# Patient Record
Sex: Female | Born: 1935 | Race: White | Hispanic: No | Smoking: Former smoker
Health system: Southern US, Community
[De-identification: ages and names within clinical notes are randomized; demographics above are authoritative.]

## PROBLEM LIST (undated history)

## (undated) DIAGNOSIS — I10 Essential (primary) hypertension: Secondary | ICD-10-CM

## (undated) DIAGNOSIS — R413 Other amnesia: Secondary | ICD-10-CM

## (undated) DIAGNOSIS — R569 Unspecified convulsions: Secondary | ICD-10-CM

## (undated) HISTORY — DX: Other amnesia: R41.3

## (undated) HISTORY — DX: Essential (primary) hypertension: I10

## (undated) HISTORY — DX: Unspecified convulsions: R56.9

---

## 1996-09-18 HISTORY — PX: OTHER SURGICAL HISTORY: SHX169

## 2004-11-28 ENCOUNTER — Ambulatory Visit: Payer: Self-pay | Admitting: Family Medicine

## 2004-12-06 ENCOUNTER — Ambulatory Visit: Payer: Self-pay | Admitting: Family Medicine

## 2004-12-13 ENCOUNTER — Ambulatory Visit: Payer: Self-pay | Admitting: Family Medicine

## 2005-03-15 ENCOUNTER — Ambulatory Visit: Payer: Self-pay | Admitting: Family Medicine

## 2005-04-19 ENCOUNTER — Ambulatory Visit: Payer: Self-pay | Admitting: Family Medicine

## 2011-09-26 DIAGNOSIS — R569 Unspecified convulsions: Secondary | ICD-10-CM | POA: Diagnosis not present

## 2011-09-26 DIAGNOSIS — Z79899 Other long term (current) drug therapy: Secondary | ICD-10-CM | POA: Diagnosis not present

## 2011-09-26 DIAGNOSIS — R413 Other amnesia: Secondary | ICD-10-CM | POA: Diagnosis not present

## 2011-09-26 DIAGNOSIS — M810 Age-related osteoporosis without current pathological fracture: Secondary | ICD-10-CM | POA: Diagnosis not present

## 2012-01-24 DIAGNOSIS — G40909 Epilepsy, unspecified, not intractable, without status epilepticus: Secondary | ICD-10-CM | POA: Diagnosis not present

## 2012-01-24 DIAGNOSIS — I10 Essential (primary) hypertension: Secondary | ICD-10-CM | POA: Diagnosis not present

## 2012-01-24 DIAGNOSIS — R269 Unspecified abnormalities of gait and mobility: Secondary | ICD-10-CM | POA: Diagnosis not present

## 2012-03-15 DIAGNOSIS — G319 Degenerative disease of nervous system, unspecified: Secondary | ICD-10-CM | POA: Diagnosis not present

## 2012-03-15 DIAGNOSIS — M810 Age-related osteoporosis without current pathological fracture: Secondary | ICD-10-CM | POA: Diagnosis present

## 2012-03-15 DIAGNOSIS — R4182 Altered mental status, unspecified: Secondary | ICD-10-CM | POA: Diagnosis not present

## 2012-03-15 DIAGNOSIS — I7090 Unspecified atherosclerosis: Secondary | ICD-10-CM | POA: Diagnosis not present

## 2012-03-15 DIAGNOSIS — Z79899 Other long term (current) drug therapy: Secondary | ICD-10-CM | POA: Diagnosis not present

## 2012-03-15 DIAGNOSIS — N390 Urinary tract infection, site not specified: Secondary | ICD-10-CM | POA: Diagnosis not present

## 2012-03-15 DIAGNOSIS — G40909 Epilepsy, unspecified, not intractable, without status epilepticus: Secondary | ICD-10-CM | POA: Diagnosis present

## 2012-03-15 DIAGNOSIS — I6789 Other cerebrovascular disease: Secondary | ICD-10-CM | POA: Diagnosis not present

## 2012-03-15 DIAGNOSIS — E876 Hypokalemia: Secondary | ICD-10-CM | POA: Diagnosis not present

## 2012-03-15 DIAGNOSIS — Z8673 Personal history of transient ischemic attack (TIA), and cerebral infarction without residual deficits: Secondary | ICD-10-CM | POA: Diagnosis not present

## 2012-03-15 DIAGNOSIS — R0789 Other chest pain: Secondary | ICD-10-CM | POA: Diagnosis not present

## 2012-03-15 DIAGNOSIS — T426X1A Poisoning by other antiepileptic and sedative-hypnotic drugs, accidental (unintentional), initial encounter: Secondary | ICD-10-CM | POA: Diagnosis not present

## 2012-03-15 DIAGNOSIS — F29 Unspecified psychosis not due to a substance or known physiological condition: Secondary | ICD-10-CM | POA: Diagnosis present

## 2012-03-15 DIAGNOSIS — I10 Essential (primary) hypertension: Secondary | ICD-10-CM | POA: Diagnosis present

## 2012-03-15 DIAGNOSIS — J984 Other disorders of lung: Secondary | ICD-10-CM | POA: Diagnosis not present

## 2012-03-25 DIAGNOSIS — R269 Unspecified abnormalities of gait and mobility: Secondary | ICD-10-CM | POA: Diagnosis not present

## 2012-03-25 DIAGNOSIS — G40919 Epilepsy, unspecified, intractable, without status epilepticus: Secondary | ICD-10-CM | POA: Diagnosis not present

## 2012-03-25 DIAGNOSIS — R4182 Altered mental status, unspecified: Secondary | ICD-10-CM | POA: Diagnosis not present

## 2012-03-25 DIAGNOSIS — Z79899 Other long term (current) drug therapy: Secondary | ICD-10-CM | POA: Diagnosis not present

## 2012-03-26 DIAGNOSIS — E876 Hypokalemia: Secondary | ICD-10-CM | POA: Diagnosis not present

## 2012-03-26 DIAGNOSIS — N390 Urinary tract infection, site not specified: Secondary | ICD-10-CM | POA: Diagnosis not present

## 2012-03-26 DIAGNOSIS — R569 Unspecified convulsions: Secondary | ICD-10-CM | POA: Diagnosis not present

## 2012-03-26 DIAGNOSIS — R403 Persistent vegetative state: Secondary | ICD-10-CM | POA: Diagnosis not present

## 2012-04-09 DIAGNOSIS — I10 Essential (primary) hypertension: Secondary | ICD-10-CM | POA: Diagnosis not present

## 2012-04-09 DIAGNOSIS — I959 Hypotension, unspecified: Secondary | ICD-10-CM | POA: Diagnosis not present

## 2012-04-09 DIAGNOSIS — E876 Hypokalemia: Secondary | ICD-10-CM | POA: Diagnosis not present

## 2012-04-09 DIAGNOSIS — N390 Urinary tract infection, site not specified: Secondary | ICD-10-CM | POA: Diagnosis not present

## 2012-05-07 DIAGNOSIS — I10 Essential (primary) hypertension: Secondary | ICD-10-CM | POA: Diagnosis not present

## 2012-05-07 DIAGNOSIS — E876 Hypokalemia: Secondary | ICD-10-CM | POA: Diagnosis not present

## 2012-05-23 DIAGNOSIS — Z79899 Other long term (current) drug therapy: Secondary | ICD-10-CM | POA: Diagnosis not present

## 2012-05-23 DIAGNOSIS — R269 Unspecified abnormalities of gait and mobility: Secondary | ICD-10-CM | POA: Diagnosis not present

## 2012-05-23 DIAGNOSIS — I10 Essential (primary) hypertension: Secondary | ICD-10-CM | POA: Diagnosis not present

## 2012-05-23 DIAGNOSIS — R569 Unspecified convulsions: Secondary | ICD-10-CM | POA: Diagnosis not present

## 2012-06-28 DIAGNOSIS — Z23 Encounter for immunization: Secondary | ICD-10-CM | POA: Diagnosis not present

## 2012-07-09 DIAGNOSIS — M810 Age-related osteoporosis without current pathological fracture: Secondary | ICD-10-CM | POA: Diagnosis not present

## 2012-07-09 DIAGNOSIS — I10 Essential (primary) hypertension: Secondary | ICD-10-CM | POA: Diagnosis not present

## 2012-07-25 DIAGNOSIS — R7989 Other specified abnormal findings of blood chemistry: Secondary | ICD-10-CM | POA: Diagnosis not present

## 2012-09-03 DIAGNOSIS — G40909 Epilepsy, unspecified, not intractable, without status epilepticus: Secondary | ICD-10-CM | POA: Diagnosis not present

## 2012-09-03 DIAGNOSIS — Z79899 Other long term (current) drug therapy: Secondary | ICD-10-CM | POA: Diagnosis not present

## 2012-09-03 DIAGNOSIS — R269 Unspecified abnormalities of gait and mobility: Secondary | ICD-10-CM | POA: Diagnosis not present

## 2012-10-02 DIAGNOSIS — M810 Age-related osteoporosis without current pathological fracture: Secondary | ICD-10-CM | POA: Diagnosis not present

## 2012-10-14 DIAGNOSIS — I10 Essential (primary) hypertension: Secondary | ICD-10-CM | POA: Diagnosis not present

## 2012-10-29 DIAGNOSIS — I10 Essential (primary) hypertension: Secondary | ICD-10-CM | POA: Diagnosis not present

## 2012-12-24 DIAGNOSIS — G40909 Epilepsy, unspecified, not intractable, without status epilepticus: Secondary | ICD-10-CM | POA: Diagnosis not present

## 2012-12-24 DIAGNOSIS — R269 Unspecified abnormalities of gait and mobility: Secondary | ICD-10-CM | POA: Diagnosis not present

## 2012-12-24 DIAGNOSIS — I10 Essential (primary) hypertension: Secondary | ICD-10-CM | POA: Diagnosis not present

## 2012-12-24 DIAGNOSIS — Z79899 Other long term (current) drug therapy: Secondary | ICD-10-CM | POA: Diagnosis not present

## 2012-12-30 ENCOUNTER — Encounter: Payer: Self-pay | Admitting: Family Medicine

## 2012-12-30 ENCOUNTER — Emergency Department (HOSPITAL_COMMUNITY)
Admission: EM | Admit: 2012-12-30 | Discharge: 2012-12-30 | Disposition: A | Discharge status: Home / Self Care | Payer: Medicare Other | Attending: Emergency Medicine | Admitting: Emergency Medicine

## 2012-12-30 ENCOUNTER — Encounter (HOSPITAL_COMMUNITY): Payer: Self-pay | Admitting: Emergency Medicine

## 2012-12-30 ENCOUNTER — Ambulatory Visit (INDEPENDENT_AMBULATORY_CARE_PROVIDER_SITE_OTHER): Payer: Medicare Other | Admitting: Family Medicine

## 2012-12-30 VITALS — BP 175/94 | HR 140 | Temp 97.6°F | Ht 60.0 in | Wt 83.0 lb

## 2012-12-30 DIAGNOSIS — R6889 Other general symptoms and signs: Secondary | ICD-10-CM | POA: Diagnosis not present

## 2012-12-30 DIAGNOSIS — I10 Essential (primary) hypertension: Secondary | ICD-10-CM | POA: Diagnosis not present

## 2012-12-30 DIAGNOSIS — I498 Other specified cardiac arrhythmias: Secondary | ICD-10-CM

## 2012-12-30 DIAGNOSIS — I471 Supraventricular tachycardia, unspecified: Secondary | ICD-10-CM | POA: Insufficient documentation

## 2012-12-30 DIAGNOSIS — R413 Other amnesia: Secondary | ICD-10-CM | POA: Insufficient documentation

## 2012-12-30 DIAGNOSIS — G40909 Epilepsy, unspecified, not intractable, without status epilepticus: Secondary | ICD-10-CM | POA: Insufficient documentation

## 2012-12-30 DIAGNOSIS — R000 Tachycardia, unspecified: Secondary | ICD-10-CM | POA: Diagnosis not present

## 2012-12-30 DIAGNOSIS — Z87891 Personal history of nicotine dependence: Secondary | ICD-10-CM | POA: Diagnosis not present

## 2012-12-30 DIAGNOSIS — Z79899 Other long term (current) drug therapy: Secondary | ICD-10-CM | POA: Diagnosis not present

## 2012-12-30 DIAGNOSIS — R9431 Abnormal electrocardiogram [ECG] [EKG]: Secondary | ICD-10-CM | POA: Insufficient documentation

## 2012-12-30 DIAGNOSIS — M810 Age-related osteoporosis without current pathological fracture: Secondary | ICD-10-CM | POA: Insufficient documentation

## 2012-12-30 LAB — CBC WITH DIFFERENTIAL/PLATELET
Basophils Absolute: 0 10*3/uL (ref 0.0–0.1)
HCT: 37.5 % (ref 36.0–46.0)
Hemoglobin: 13 g/dL (ref 12.0–15.0)
Lymphocytes Relative: 11 % — ABNORMAL LOW (ref 12–46)
Monocytes Absolute: 0.4 10*3/uL (ref 0.1–1.0)
Neutro Abs: 7.9 10*3/uL — ABNORMAL HIGH (ref 1.7–7.7)
RBC: 4.3 MIL/uL (ref 3.87–5.11)
RDW: 12.9 % (ref 11.5–15.5)
WBC: 9.5 10*3/uL (ref 4.0–10.5)

## 2012-12-30 MED ORDER — METOPROLOL TARTRATE 25 MG PO TABS
25.0000 mg | ORAL_TABLET | Freq: Once | ORAL | Status: AC
  Administered 2012-12-30: 25 mg via ORAL
  Filled 2012-12-30: qty 1

## 2012-12-30 MED ORDER — METOPROLOL TARTRATE 50 MG PO TABS: 25.0000 mg | ORAL_TABLET | Freq: Two times a day (BID) | ORAL | Status: DC

## 2012-12-30 NOTE — ED Notes (Signed)
Pt arrives to ed via [facility].  ems sts pt was being seen at pcp and noticed bp 175/94 Hr 140.  ems obtained bp183/77 HR 104. Pt caox4, pmsx4, no loc, no HA, no change vision.  Pt denies recent illness/injury.

## 2012-12-30 NOTE — Consult Note (Signed)
CARDIOLOGY CONSULT NOTE  Patient ID: Melinda Ayala MRN: [id number] DOB/AGE: 25-Jan-1922 79 y.o.  Admit date: 12/30/2012 Primary Physician Dr. Modesto Charon  Primary Cardiologist New Reason for Consultation Tachycardia.    HPI: 79 y/o female with no previous cardiac history. She has known history of hypertension, subdural hematoma s/p surgery in 1986, and seizure disorder. She presented with her PCP's office for a routine follow up visit regarding hypertension. She was noted to have SBP>200. HR was initially 110 BPM which increased to 140 BPM. ECG was suggestive of SVT with underlying IVCD. She converted spontaneously to NSR without intervention.  She denies any symptoms. No chest pain, dyspnea, palpitations or dizziness.  She lives with her daughter.  She is on lisinopril 2.5 mg once daily for hypertension.   Family history: Son with MI (last month).      Review of systems complete and found to be negative unless listed above   Past Medical History  Diagnosis Date  . Seizures   . Hypertension   . Memory loss     History reviewed. No pertinent family history.  History   Social History  . Marital Status: Widowed    Spouse Name: N/A    Number of Children: N/A  . Years of Education: N/A   Occupational History  . Not on file.   Social History Main Topics  . Smoking status: Former Smoker    Types: Cigarettes    Quit date: 09/18/1974  . Smokeless tobacco: Current User    Types: Snuff     Comment: dips snuff  . Alcohol Use: Not on file  . Drug Use: Not on file  . Sexually Active: Not on file   Other Topics Concern  . Not on file   Social History Narrative  . No narrative on file    Past Surgical History  Procedure Laterality Date  . Blood clot  1998    on brain       (Not in a hospital admission)  Physical Exam: Blood pressure 176/80, pulse 103, temperature 98.3 F (36.8 C), temperature source Oral, resp. rate 16, SpO2 99.00%.   Constitutional: She is oriented  to person, place, and time. She appears underweight. No distress.  HENT: No nasal discharge.  Head: Normocephalic and atraumatic.  Eyes: Pupils are equal and round.  Neck: Normal range of motion. Neck supple. No JVD present. No thyromegaly present.  Cardiovascular: Normal rate, regular rhythm, normal heart sounds. Exam reveals no gallop and no friction rub. No murmur heard.  Pulmonary/Chest: Effort normal and breath sounds normal. No stridor. No respiratory distress. She has no wheezes. She has no rales. She exhibits no tenderness.  Abdominal: Soft. Bowel sounds are normal. She exhibits no distension. There is no tenderness. There is no rebound and no guarding.  Musculoskeletal: Normal range of motion. She exhibits no edema and no tenderness.  Neurological: She is alert and oriented to person, place, and time. Coordination normal.  Skin: Skin is warm and dry. No rash noted. She is not diaphoretic. No erythema. No pallor.  Psychiatric: She has a normal mood and affect. Her behavior is normal. Judgment and thought content normal.    Labs:   Lab Results  Component Value Date   WBC 9.5 12/30/2012   HGB 13.0 12/30/2012   HCT 37.5 12/30/2012   MCV 87.2 12/30/2012   PLT 244 12/30/2012   No results found for this basename: NA, K, CL, CO2, BUN, CREATININE, CALCIUM, LABALBU, PROT, BILITOT, ALKPHOS, ALT,  AST, GLUCOSE,  in the last 168 hours No results found for this basename: CKTOTAL, CKMB, CKMBINDEX, TROPONINI    No results found for this basename: CHOL   No results found for this basename: HDL   No results found for this basename: LDLCALC   No results found for this basename: TRIG   No results found for this basename: CHOLHDL   No results found for this basename: LDLDIRECT      EKG: SVT with IVCD. Currently in NSR with IVCD  ASSESSMENT AND PLAN:    1. Paroxysmal SVT: this seems to be first documented episode. She seems to be asymptomatic. Check routine labs including CBC, BMP and TSH.    Recommend starting Metoprolol 25 mg bid.  If she has recurrent arrhythmia, then an echo and cardiology follow up is recommended.   2. Hypertension: BP is elevated. Hopefully, this will improve with Metoprolol.    Signed:  Lorine Bears MD, [facility] 12/30/2012, 5:16 PM

## 2012-12-30 NOTE — Progress Notes (Signed)
Patient ID: Melinda Ayala, female   DOB: Dec 11, 1921, 79 y.o.   MRN: [id number] SUBJECTIVE: HPI: Patient is here for follow up of hypertension: denies Headache;deniesChest Pain;denies weakness;denies Shortness of Breath or Orthopnea;denies Visual changes;denies palpitations;denies cough;denies pedal edema;denies symptoms of TIA or stroke; admits to Compliance with medications. denies Problems with medications.  No Change in memory impairment. Accompanied by son, who has history of heart disease and a pacemaker.  PMH/PSH: reviewed/updated in Epic  SH/FH: reviewed/updated in Epic  Allergies: reviewed/updated in Epic  Medications: reviewed/updated in Epic  Immunizations: reviewed/updated in Epic  ROS: As above in the HPI. All other systems are stable or negative.  OBJECTIVE: APPEARANCE:  Elderly frail Patient in no acute distress.The patient appeared well nourished and normally developed. Acyanotic. Waist: VITAL SIGNS:BP 175/94  Pulse 140  Temp(Src) 97.6 F (36.4 C) (Oral)  Ht 5' (1.524 m)  Wt 83 lb (37.649 kg)  BMI 16.21 kg/m2 kyphosis  SKIN: warm and  Dry without overt rashes, tattoos and scars  HEAD and Neck: without JVD, Head and scalp: normal Eyes:No scleral icterus. Fundi normal, eye movements normal. Ears: Auricle normal, canal normal, Tympanic membranes normal, insufflation normal. Nose: normal Throat: normal Neck & thyroid: normal  CHEST & LUNGS: Chest wall: normal Lungs: Clear  CVS: Reveals the PMI to be normally located. IRRegular rhythm, Tachycardic First and Second Heart sounds are heardl,  absence of murmurs, rubsPeripheral vasculature: Radial pulses: abnormal rapid and irregular   ABDOMEN:  Appearance: normal Benign,, no organomegaly, no masses, no Abdominal Aortic enlargement. No Guarding , no rebound. No Bruits. Bowel sounds: normal  RECTAL: N/A GU: N/A  EXTREMETIES: nonedematous.   MUSCULOSKELETAL:  Spine:  kyphotic   NEUROLOGIC: oriented to time,place and person; nonfocal. Strength is normal  ASSESSMENT: HTN (hypertension) - Plan: EKG 12-Lead  SVT (supraventricular tachycardia)  Tachycardia - Plan: EKG 12-Lead  Seizure disorder  Osteoporosis, unspecified  Memory loss  Nonspecific abnormal electrocardiogram (ECG) (EKG) New onset SVT with lateral wall ischemia in a 79 yr old woman. Needs further evaluation and treatment in ED   PLAN: Orders Placed This Encounter  Procedures  . EKG 12-Lead   No results found for this or any previous visit (from the past 24 hour(s)). Meds ordered this encounter  Medications  . TEGRETOL-XR 200 MG 12 hr tablet    Sig:   . B-D UF III MINI PEN NEEDLES 31G X 5 MM MISC    Sig:   . VIMPAT 100 MG TABS    Sig:   . lisinopril (PRINIVIL,ZESTRIL) 2.5 MG tablet    Sig:   . FORTEO 600 MCG/2.4ML SOLN    Sig:   IV KVO O2 via nasal cannula Transfer to [facility] Ed for further evaluation and treatment.  Azizah Lisle P. Modesto Charon, M.D.

## 2012-12-30 NOTE — ED Provider Notes (Signed)
History     CSN: [id number]  Arrival date & time 12/30/12  1430   First MD Initiated Contact with Patient 12/30/12 1433      Chief Complaint  Patient presents with  . Hypertension    (Consider location/radiation/quality/duration/timing/severity/associated sxs/prior treatment) HPI Patient seen by Dr. Modesto Charon this morning for a routine visit for hypertension. Patient asymptomatic during visit. Noted to be in SVT rhythm at Dr. Nash Dimmer office sent here for further evaluation. Patient denies any chest pain denies shortness of breath denies abdominal pain no treatment prior to coming here. Rhythm converted spontaneously Past Medical History  Diagnosis Date  . Seizures   . Hypertension   . Memory loss     Past Surgical History  Procedure Laterality Date  . Blood clot  1998    on brain     History reviewed. No pertinent family history.  History  Substance Use Topics  . Smoking status: Former Smoker    Types: Cigarettes    Quit date: 09/18/1974  . Smokeless tobacco: Current User    Types: Snuff     Comment: dips snuff  . Alcohol Use: Not on file    OB History   Grav Para Term Preterm Abortions TAB SAB Ect Mult Living                  Review of Systems  Constitutional: Negative.   HENT: Negative.   Respiratory: Negative.   Cardiovascular: Negative.   Gastrointestinal: Negative.   Musculoskeletal: Negative.   Skin: Negative.   Neurological: Negative.   Psychiatric/Behavioral: Negative.     Allergies  Review of patient's allergies indicates no known allergies.  Home Medications   Current Outpatient Rx  Name  Route  Sig  Dispense  Refill  . FORTEO 600 MCG/2.4ML SOLN               . lisinopril (PRINIVIL,ZESTRIL) 2.5 MG tablet               . TEGRETOL-XR 200 MG 12 hr tablet               . VIMPAT 100 MG TABS                 BP 176/80  Pulse 103  Temp(Src) 98.3 F (36.8 C) (Oral)  Resp 16  SpO2 99%  Physical Exam  Nursing note and  vitals reviewed. Constitutional: She is oriented to person, place, and time. She appears well-developed and well-nourished.  HENT:  Head: Normocephalic and atraumatic.  Eyes: Conjunctivae are normal. Pupils are equal, round, and reactive to light.  Neck: Neck supple. No tracheal deviation present. No thyromegaly present.  Cardiovascular: Normal rate and regular rhythm.   No murmur heard. Pulmonary/Chest: Effort normal and breath sounds normal.  Abdominal: Soft. Bowel sounds are normal. She exhibits no distension. There is no tenderness.  Musculoskeletal: Normal range of motion. She exhibits no edema and no tenderness.  Neurological: She is alert and oriented to person, place, and time. Coordination normal.  Skin: Skin is warm and dry. No rash noted.  Psychiatric: She has a normal mood and affect.    ED Course  Procedures (including critical care time)  Labs Reviewed  CBC WITH DIFFERENTIAL   No results found.   Date: 12/30/2012 1431 PM  Rate: 100  Rhythm: sinus tachycardia  QRS Axis: normal  Intervals: normal  ST/T Wave abnormalities: nonspecific ST/T changes  Conduction Disutrbances:left bundle branch block  Narrative Interpretation:   Old EKG  Reviewed: tracing from 12:31 PM today showed SVT rhythm 140 beats per minute left bundle branch block otherwise unchanged interpreted by me   Results for orders placed during the hospital encounter of 12/30/12  CBC WITH DIFFERENTIAL      Result Value Range   WBC 9.5  4.0 - 10.5 K/uL   RBC 4.30  3.87 - 5.11 MIL/uL   Hemoglobin 13.0  12.0 - 15.0 g/dL   HCT 40.9  81.1 - 91.4 %   MCV 87.2  78.0 - 100.0 fL   MCH 30.2  26.0 - 34.0 pg   MCHC 34.7  30.0 - 36.0 g/dL   RDW 78.2  95.6 - 21.3 %   Platelets 244  150 - 400 K/uL   Neutrophils Relative 84 (*) 43 - 77 %   Neutro Abs 7.9 (*) 1.7 - 7.7 K/uL   Lymphocytes Relative 11 (*) 12 - 46 %   Lymphs Abs 1.1  0.7 - 4.0 K/uL   Monocytes Relative 4  3 - 12 %   Monocytes Absolute 0.4  0.1 -  1.0 K/uL   Eosinophils Relative 1  0 - 5 %   Eosinophils Absolute 0.1  0.0 - 0.7 K/uL   Basophils Relative 0  0 - 1 %   Basophils Absolute 0.0  0.0 - 0.1 K/uL   No results found.  No diagnosis found. i-STAT 8 is within normal limits Remained asymptomaticand in normal sinus rhythm throughout entire emergency department course MDM  Spoke with [facility] who will evalaute pt in the ED.patient was evaluated Dr. Kirke Corin From cardiology. Plan prescription for Toprol 25 mg twice a day one month's supply written follow up with Dr.Wong in one week TSH to be checked as outpatient by Dr. Modesto Charon Blood pressure recheck next week Diagnosis:#1paroxysmal supraventricular tachycardia  #2 hypertension      Doug Sou, MD 12/30/12 1819

## 2012-12-30 NOTE — Progress Notes (Signed)
ekg performed and per Dr Abundio Miu [facility] notified and spoke to [facility] triage ER and info given to her Pt will be transported via EMS   O2at @L  per nasal cannula Family member at bedside

## 2012-12-31 ENCOUNTER — Telehealth: Payer: Self-pay | Admitting: Family Medicine

## 2012-12-31 NOTE — Telephone Encounter (Signed)
Appt given for 4-21 per pt request

## 2013-01-06 ENCOUNTER — Encounter: Payer: Self-pay | Admitting: Family Medicine

## 2013-01-06 ENCOUNTER — Ambulatory Visit (INDEPENDENT_AMBULATORY_CARE_PROVIDER_SITE_OTHER): Payer: Medicare Other | Admitting: Family Medicine

## 2013-01-06 VITALS — BP 153/66 | HR 72 | Temp 97.2°F | Ht 61.0 in | Wt 83.2 lb

## 2013-01-06 DIAGNOSIS — M810 Age-related osteoporosis without current pathological fracture: Secondary | ICD-10-CM

## 2013-01-06 DIAGNOSIS — R413 Other amnesia: Secondary | ICD-10-CM | POA: Diagnosis not present

## 2013-01-06 DIAGNOSIS — I10 Essential (primary) hypertension: Secondary | ICD-10-CM | POA: Diagnosis not present

## 2013-01-06 DIAGNOSIS — G40909 Epilepsy, unspecified, not intractable, without status epilepticus: Secondary | ICD-10-CM | POA: Diagnosis not present

## 2013-01-06 NOTE — Progress Notes (Signed)
Patient ID: Melinda Ayala, female   DOB: 1922/02/20, 79 y.o.   MRN: [id number] SUBJECTIVE: HPI: Patient is here for follow up of hypertension: denies Headache;deniesChest Pain;denies weakness;denies Shortness of Breath or Orthopnea;denies Visual changes;denies palpitations;denies cough;denies pedal edema;denies symptoms of TIA or stroke; admits to Compliance with medications. denies Problems with medications. Doing Great feeling great  PMH/PSH: reviewed/updated in Epic  SH/FH: reviewed/updated in Epic  Allergies: reviewed/updated in Epic  Medications: reviewed/updated in Epic  Immunizations: reviewed/updated in Epic  ROS: As above in the HPI. All other systems are stable or negative.  OBJECTIVE: APPEARANCE:  Patient in no acute distress.The patient appeared well nourished and normally developed. Acyanotic. Waist: VITAL SIGNS:BP 153/66  Pulse 72  Temp(Src) 97.2 F (36.2 C) (Oral)  Ht 5\' 1"  (1.549 m)  Wt 83 lb 3.2 oz (37.739 kg)  BMI 15.73 kg/m2 kyphosis  SKIN: warm and  Dry without overt rashes, tattoos and scars  HEAD and Neck: without JVD, Head and scalp: normal Eyes:No scleral icterus. Fundi normal, eye movements normal. Ears: Auricle normal, canal normal, Tympanic membranes normal, insufflation normal. Nose: normal Throat: normal Neck & thyroid: normal  CHEST & LUNGS: Chest wall: normal Lungs: Clear  CVS: Reveals the PMI to be normally located. Regular rhythm, First and Second Heart sounds are normal,  absence of murmurs, rubs or gallops. Peripheral vasculature: Radial pulses: normal Dorsal pedis pulses: normal Posterior pulses: normal  ABDOMEN:  Appearance: normal Benign,, no organomegaly, no masses, no Abdominal Aortic enlargement. No Guarding , no rebound. No Bruits. Bowel sounds: normal  RECTAL: N/A GU: N/A  EXTREMETIES: nonedematous. Both Femoral and Pedal pulses are normal.  MUSCULOSKELETAL:  Spine:kyphosis Joints: intact. Old right  clavicular fracture/ deformity.  NEUROLOGIC: oriented to time,place and person; nonfocal. Strength is normal Sensory is normal Reflexes are normal  ASSESSMENT: HTN (hypertension)  Memory loss  Osteoporosis, unspecified  Seizure disorder  pressures and for this elderly lady. Any further lowering of blood pressure would produce symptoms and risk.  PLAN:  No orders of the defined types were placed in this encounter.   No results found for this or any previous visit (from the past 24 hour(s)). No orders of the defined types were placed in this encounter.   Condition stable. Continue present medication. Continue home care. Family is taking care of her. Return to clinic in 3 Continue with  the neurologist  Nattaly Yebra P. Modesto Charon, M.D.

## 2013-01-09 ENCOUNTER — Telehealth: Payer: Self-pay | Admitting: Family Medicine

## 2013-01-13 NOTE — Telephone Encounter (Signed)
Prior Auth Dept was working on last week.  If PA still pending then can give patient 1 month sample until approved

## 2013-01-13 NOTE — Telephone Encounter (Signed)
[name]/Michelle, Could you please look into this and take care of it please. Thanks. FW

## 2013-01-13 NOTE — Telephone Encounter (Signed)
PER NOTES BY DONNA LAWSON PT TO CHECK WITH [facility] TO SEE IF MEDICINE FOR FORTEO WILL PROCESS NOW. NOTES SAYS WILL COVER UNTIL 01/10/13

## 2013-01-27 ENCOUNTER — Other Ambulatory Visit: Payer: Self-pay | Admitting: Family Medicine

## 2013-02-05 ENCOUNTER — Other Ambulatory Visit: Payer: Self-pay | Admitting: Family Medicine

## 2013-04-17 ENCOUNTER — Encounter: Payer: Self-pay | Admitting: Family Medicine

## 2013-04-17 ENCOUNTER — Ambulatory Visit (INDEPENDENT_AMBULATORY_CARE_PROVIDER_SITE_OTHER): Payer: Medicare Other | Admitting: Family Medicine

## 2013-04-17 VITALS — BP 139/52 | HR 65 | Temp 97.1°F | Wt 84.6 lb

## 2013-04-17 DIAGNOSIS — I10 Essential (primary) hypertension: Secondary | ICD-10-CM | POA: Diagnosis not present

## 2013-04-17 DIAGNOSIS — I498 Other specified cardiac arrhythmias: Secondary | ICD-10-CM

## 2013-04-17 DIAGNOSIS — G40909 Epilepsy, unspecified, not intractable, without status epilepticus: Secondary | ICD-10-CM

## 2013-04-17 DIAGNOSIS — I471 Supraventricular tachycardia, unspecified: Secondary | ICD-10-CM

## 2013-04-17 DIAGNOSIS — R413 Other amnesia: Secondary | ICD-10-CM | POA: Diagnosis not present

## 2013-04-17 DIAGNOSIS — R000 Tachycardia, unspecified: Secondary | ICD-10-CM

## 2013-04-17 DIAGNOSIS — M810 Age-related osteoporosis without current pathological fracture: Secondary | ICD-10-CM

## 2013-04-17 MED ORDER — LISINOPRIL 2.5 MG PO TABS: ORAL_TABLET | ORAL | Status: DC

## 2013-04-17 MED ORDER — METOPROLOL TARTRATE 50 MG PO TABS: ORAL_TABLET | ORAL | Status: DC

## 2013-04-17 NOTE — Progress Notes (Signed)
Patient ID: Melinda Ayala, female   DOB: 10-30-1921, 79 y.o.   MRN: [id number] SUBJECTIVE: CC: Chief Complaint  Patient presents with  . Follow-up    3  month     HPI: Patient is here for follow up of hypertension:/memory loss/seizure disorder denies Headache;deniesChest Pain;denies weakness;denies Shortness of Breath or Orthopnea;denies Visual changes;denies palpitations;denies cough;denies pedal edema;denies symptoms of TIA or stroke; admits to Compliance with medications. denies Problems with medications.  Past Medical History  Diagnosis Date  . Seizures   . Hypertension   . Memory loss    Past Surgical History  Procedure Laterality Date  . Blood clot  1998    on brain    History   Social History  . Marital Status: Widowed    Spouse Name: N/A    Number of Children: N/A  . Years of Education: N/A   Occupational History  . Not on file.   Social History Main Topics  . Smoking status: Former Smoker    Types: Cigarettes    Quit date: 09/18/1974  . Smokeless tobacco: Current User    Types: Snuff     Comment: dips snuff  . Alcohol Use: Not on file  . Drug Use: Not on file  . Sexually Active: Not on file   Other Topics Concern  . Not on file   Social History Narrative  . No narrative on file   No family history on file. Current Outpatient Prescriptions on File Prior to Visit  Medication Sig Dispense Refill  . FORTEO 600 MCG/2.4ML SOLN Inject into the skin daily. 0.6 ml      . Iron-Vitamins (GERITOL PO) Take 1 tablet by mouth daily.      Marland Kitchen lisinopril (PRINIVIL,ZESTRIL) 2.5 MG tablet TAKE 1 TABLET BY MOUTH ONCE DAILY  30 tablet  3  . metoprolol (LOPRESSOR) 50 MG tablet TAKE 1/2 TABLET 2 TIMES A DAY  30 tablet  2  . TEGRETOL-XR 200 MG 12 hr tablet Take 200 mg by mouth 2 (two) times daily.       Marland Kitchen VIMPAT 100 MG TABS Take 100 mg by mouth 2 (two) times daily.        No current facility-administered medications on file prior to visit.   No Known  Allergies Immunization History  Administered Date(s) Administered  . Pneumococcal Polysaccharide 10/19/2008   Prior to Admission medications   Medication Sig Start Date End Date Taking? Authorizing Provider  B-D UF III MINI PEN NEEDLES 31G X 5 MM MISC  02/24/13  Yes Historical Provider, MD  FORTEO 600 MCG/2.4ML SOLN Inject into the skin daily. 0.6 ml 11/21/12  Yes Historical Provider, MD  Iron-Vitamins (GERITOL PO) Take 1 tablet by mouth daily.   Yes Historical Provider, MD  lisinopril (PRINIVIL,ZESTRIL) 2.5 MG tablet TAKE 1 TABLET BY MOUTH ONCE DAILY 02/05/13  Yes Ileana Ladd, MD  metoprolol (LOPRESSOR) 50 MG tablet TAKE 1/2 TABLET 2 TIMES A DAY 01/27/13  Yes Ileana Ladd, MD  TEGRETOL-XR 200 MG 12 hr tablet Take 200 mg by mouth 2 (two) times daily.  12/24/12  Yes Historical Provider, MD  VIMPAT 100 MG TABS Take 100 mg by mouth 2 (two) times daily.  12/06/12  Yes Historical Provider, MD    ROS: As above in the HPI. All other systems are stable or negative.  OBJECTIVE: APPEARANCE:  Patient in no acute distress.The patient appeared well nourished and normally developed. Acyanotic. Waist: VITAL SIGNS:BP 139/52  Pulse 65  Temp(Src) 97.1 F (36.2  C) (Oral)  Wt 84 lb 9.6 oz (38.374 kg)  BMI 15.99 kg/m2  Elderly Frail WF kyphosis  SKIN: warm and  Dry without overt rashes, tattoos and scars  HEAD and Neck: without JVD, Head and scalp: normal Eyes:No scleral icterus. Fundi normal, eye movements normal. Ears: Auricle normal, canal normal, Tympanic membranes normal, insufflation normal. Nose: normal Throat: normal. Missing teeth Neck & thyroid: normal  CHEST & LUNGS: Chest wall: normal Lungs: Clear  CVS: Reveals the PMI to be normally located. Regular rhythm, First and Second Heart sounds are normal,  absence of murmurs, rubs or gallops. Peripheral vasculature: Radial pulses: normal Dorsal pedis pulses: normal Posterior pulses: normal  ABDOMEN:  Appearance: normal Benign,  no organomegaly, no masses, no Abdominal Aortic enlargement. No Guarding , no rebound. No Bruits. Bowel sounds: normal  RECTAL: N/A GU: N/A  EXTREMETIES: nonedematous. Both Femoral and Pedal pulses are normal.  MUSCULOSKELETAL:  Spine: kyphosis  NEUROLOGIC: oriented to time,place and person; nonfocal.  ASSESSMENT: HTN (hypertension) - Plan: metoprolol (LOPRESSOR) 50 MG tablet, lisinopril (PRINIVIL,ZESTRIL) 2.5 MG tablet, BMP8+EGFR, TSH  SVT (supraventricular tachycardia)  Memory loss  Seizure disorder  Osteoporosis, unspecified  Tachycardia    PLAN:  Orders Placed This Encounter  Procedures  . BMP8+EGFR  . TSH   Meds ordered this encounter  Medications  . B-D UF III MINI PEN NEEDLES 31G X 5 MM MISC    Sig:   . metoprolol (LOPRESSOR) 50 MG tablet    Sig: TAKE 1/2 TABLET 2 TIMES A DAY    Dispense:  30 tablet    Refill:  11  . lisinopril (PRINIVIL,ZESTRIL) 2.5 MG tablet    Sig: TAKE 1 TABLET BY MOUTH ONCE DAILY    Dispense:  30 tablet    Refill:  11   Results for orders placed during the hospital encounter of 12/30/12  CBC WITH DIFFERENTIAL      Result Value Range   WBC 9.5  4.0 - 10.5 K/uL   RBC 4.30  3.87 - 5.11 MIL/uL   Hemoglobin 13.0  12.0 - 15.0 g/dL   HCT 16.1  09.6 - 04.5 %   MCV 87.2  78.0 - 100.0 fL   MCH 30.2  26.0 - 34.0 pg   MCHC 34.7  30.0 - 36.0 g/dL   RDW 40.9  81.1 - 91.4 %   Platelets 244  150 - 400 K/uL   Neutrophils Relative % 84 (*) 43 - 77 %   Neutro Abs 7.9 (*) 1.7 - 7.7 K/uL   Lymphocytes Relative 11 (*) 12 - 46 %   Lymphs Abs 1.1  0.7 - 4.0 K/uL   Monocytes Relative 4  3 - 12 %   Monocytes Absolute 0.4  0.1 - 1.0 K/uL   Eosinophils Relative 1  0 - 5 %   Eosinophils Absolute 0.1  0.0 - 0.7 K/uL   Basophils Relative 0  0 - 1 %   Basophils Absolute 0.0  0.0 - 0.1 K/uL  POCT I-STAT TROPONIN I      Result Value Range   Troponin i, poc 0.02  0.00 - 0.08 ng/mL   Comment 3            Return in about 4 months (around  08/17/2013) for Recheck medical problems.  Tuwanna Krausz P. Modesto Charon, M.D.

## 2013-04-18 LAB — BMP8+EGFR
BUN/Creatinine Ratio: 17 (ref 11–26)
BUN: 16 mg/dL (ref 8–27)
CO2: 24 mmol/L (ref 18–29)
Calcium: 10.3 mg/dL — ABNORMAL HIGH (ref 8.6–10.2)
Chloride: 102 mmol/L (ref 97–108)
Creatinine, Ser: 0.92 mg/dL (ref 0.57–1.00)
GFR calc Af Amer: 64 mL/min/{1.73_m2} (ref >59)
GFR calc non Af Amer: 56 mL/min/{1.73_m2} — ABNORMAL LOW (ref >59)
Glucose: 90 mg/dL (ref 65–99)
Potassium: 4.2 mmol/L (ref 3.5–5.2)
Sodium: 141 mmol/L (ref 134–144)

## 2013-04-18 LAB — TSH: TSH: 1.46 u[IU]/mL (ref 0.450–4.500)

## 2013-04-19 NOTE — Progress Notes (Signed)
Quick Note:  Lab result at goal. No change in Medications for now. No Change in plans and follow up. ______ 

## 2013-04-23 ENCOUNTER — Other Ambulatory Visit: Payer: Self-pay | Admitting: Family Medicine

## 2013-04-23 DIAGNOSIS — Z79899 Other long term (current) drug therapy: Secondary | ICD-10-CM | POA: Diagnosis not present

## 2013-04-23 DIAGNOSIS — I10 Essential (primary) hypertension: Secondary | ICD-10-CM | POA: Diagnosis not present

## 2013-04-23 DIAGNOSIS — G40909 Epilepsy, unspecified, not intractable, without status epilepticus: Secondary | ICD-10-CM | POA: Diagnosis not present

## 2013-04-23 DIAGNOSIS — R269 Unspecified abnormalities of gait and mobility: Secondary | ICD-10-CM | POA: Diagnosis not present

## 2013-06-03 DIAGNOSIS — Z23 Encounter for immunization: Secondary | ICD-10-CM | POA: Diagnosis not present

## 2013-08-19 ENCOUNTER — Ambulatory Visit: Payer: Medicare Other | Admitting: Family Medicine

## 2013-08-21 DIAGNOSIS — R269 Unspecified abnormalities of gait and mobility: Secondary | ICD-10-CM | POA: Diagnosis not present

## 2013-08-21 DIAGNOSIS — I10 Essential (primary) hypertension: Secondary | ICD-10-CM | POA: Diagnosis not present

## 2013-08-21 DIAGNOSIS — Z79899 Other long term (current) drug therapy: Secondary | ICD-10-CM | POA: Diagnosis not present

## 2013-08-21 DIAGNOSIS — G40909 Epilepsy, unspecified, not intractable, without status epilepticus: Secondary | ICD-10-CM | POA: Diagnosis not present

## 2013-09-02 ENCOUNTER — Telehealth: Payer: Self-pay | Admitting: Family Medicine

## 2013-09-02 NOTE — Telephone Encounter (Signed)
Spoke with pt daughter and has been dizzy for 2 days. No other complaints. Advised to take her to er today but wanted to wait for appt on wed.

## 2013-09-03 ENCOUNTER — Ambulatory Visit (INDEPENDENT_AMBULATORY_CARE_PROVIDER_SITE_OTHER): Payer: Medicare Other | Admitting: General Practice

## 2013-09-03 ENCOUNTER — Encounter: Payer: Self-pay | Admitting: General Practice

## 2013-09-03 VITALS — BP 121/69 | HR 81 | Temp 98.0°F | Ht 61.0 in | Wt 83.0 lb

## 2013-09-03 DIAGNOSIS — N390 Urinary tract infection, site not specified: Secondary | ICD-10-CM

## 2013-09-03 DIAGNOSIS — R42 Dizziness and giddiness: Secondary | ICD-10-CM

## 2013-09-03 LAB — POCT UA - MICROSCOPIC ONLY
Casts, Ur, LPF, POC: NEGATIVE
Crystals, Ur, HPF, POC: NEGATIVE

## 2013-09-03 LAB — POCT URINALYSIS DIPSTICK
Ketones, UA: NEGATIVE
Spec Grav, UA: 1.01
Urobilinogen, UA: NEGATIVE
pH, UA: 7

## 2013-09-03 MED ORDER — CIPROFLOXACIN HCL 500 MG PO TABS: 500.0000 mg | ORAL_TABLET | Freq: Two times a day (BID) | ORAL | Status: DC

## 2013-09-03 NOTE — Progress Notes (Signed)
   Subjective:    Patient ID: ROSSANNA SPITZLEY, female    DOB: 29-Feb-1922, 79 y.o.   MRN: [id number]  HPI Patient presents today with complaints of dizziness. She reports onset of this episodes as 2-3 days ago, but has had episodes in the past. She is accompanied by her daughter, who verbalized the dizzy spells seem to occur more frequently. Worsening of dizziness with change of position, especially going from sitting to standing position.     Review of Systems  Constitutional: Negative for fever and chills.  HENT: Negative for congestion and sinus pressure.   Respiratory: Negative for chest tightness and shortness of breath.   Cardiovascular: Negative for chest pain and palpitations.  Gastrointestinal: Negative for nausea, vomiting, abdominal pain, diarrhea and constipation.  Genitourinary: Negative for dysuria, hematuria and difficulty urinating.  Neurological: Negative for weakness and headaches.       Objective:   Physical Exam  Constitutional: She is oriented to person, place, and time. She appears well-developed and well-nourished.  HENT:  Head: Normocephalic and atraumatic.  Right Ear: External ear normal.  Left Ear: External ear normal.  Mouth/Throat: Oropharynx is clear and moist.  Eyes: EOM are normal.  Neck: Normal range of motion. Neck supple. No thyromegaly present.  Cardiovascular: Normal rate, regular rhythm and normal heart sounds.   Pulmonary/Chest: Effort normal and breath sounds normal. No respiratory distress. She exhibits no tenderness.  Lymphadenopathy:    She has no cervical adenopathy.  Neurological: She is alert and oriented to person, place, and time.  Skin: Skin is warm and dry.  Psychiatric: She has a normal mood and affect.     Results for orders placed in visit on 09/03/13  POCT UA - MICROSCOPIC ONLY      Result Value Range   WBC, Ur, HPF, POC tntc     RBC, urine, microscopic 15-20     Bacteria, U Microscopic many     Mucus, UA negative     Epithelial cells, urine per micros few     Crystals, Ur, HPF, POC negative     Casts, Ur, LPF, POC negative     Yeast, UA negative    POCT URINALYSIS DIPSTICK      Result Value Range   Color, UA gold     Clarity, UA cloudy     Glucose, UA negative     Bilirubin, UA negative     Ketones, UA negative     Spec Grav, UA 1.010     Blood, UA large     pH, UA 7.0     Protein, UA 1+     Urobilinogen, UA negative     Nitrite, UA negative     Leukocytes, UA large (3+)          Assessment & Plan:  1. Dizziness  - POCT UA - Microscopic Only - POCT urinalysis dipstick - Urine culture  2. Urinary tract infection, site not specified  - Urine culture - ciprofloxacin (CIPRO) 500 MG tablet; Take 1 tablet (500 mg total) by mouth 2 (two) times daily.  Dispense: 20 tablet; Refill: 0 -increase fluids -RTO if symptoms worsen or unresolved -Patient and daughter verbalized understanding Coralie Keens, FNP-C

## 2013-09-03 NOTE — Patient Instructions (Signed)
Urinary Tract Infection  Urinary tract infections (UTIs) can develop anywhere along your urinary tract. Your urinary tract is your body's drainage system for removing wastes and extra water. Your urinary tract includes two kidneys, two ureters, a bladder, and a urethra. Your kidneys are a pair of bean-shaped organs. Each kidney is about the size of your fist. They are located below your ribs, one on each side of your spine.  CAUSES  Infections are caused by microbes, which are microscopic organisms, including fungi, viruses, and bacteria. These organisms are so small that they can only be seen through a microscope. Bacteria are the microbes that most commonly cause UTIs.  SYMPTOMS   Symptoms of UTIs may vary by age and gender of the patient and by the location of the infection. Symptoms in young women typically include a frequent and intense urge to urinate and a painful, burning feeling in the bladder or urethra during urination. Older women and men are more likely to be tired, shaky, and weak and have muscle aches and abdominal pain. A fever may mean the infection is in your kidneys. Other symptoms of a kidney infection include pain in your back or sides below the ribs, nausea, and vomiting.  DIAGNOSIS  To diagnose a UTI, your caregiver will ask you about your symptoms. Your caregiver also will ask to provide a urine sample. The urine sample will be tested for bacteria and white blood cells. White blood cells are made by your body to help fight infection.  TREATMENT   Typically, UTIs can be treated with medication. Because most UTIs are caused by a bacterial infection, they usually can be treated with the use of antibiotics. The choice of antibiotic and length of treatment depend on your symptoms and the type of bacteria causing your infection.  HOME CARE INSTRUCTIONS   If you were prescribed antibiotics, take them exactly as your caregiver instructs you. Finish the medication even if you feel better after you  have only taken some of the medication.   Drink enough water and fluids to keep your urine clear or pale yellow.   Avoid caffeine, tea, and carbonated beverages. They tend to irritate your bladder.   Empty your bladder often. Avoid holding urine for long periods of time.   Empty your bladder before and after sexual intercourse.   After a bowel movement, women should cleanse from front to back. Use each tissue only once.  SEEK MEDICAL CARE IF:    You have back pain.   You develop a fever.   Your symptoms do not begin to resolve within 3 days.  SEEK IMMEDIATE MEDICAL CARE IF:    You have severe back pain or lower abdominal pain.   You develop chills.   You have nausea or vomiting.   You have continued burning or discomfort with urination.  MAKE SURE YOU:    Understand these instructions.   Will watch your condition.   Will get help right away if you are not doing well or get worse.  Document Released: 06/14/2005 Document Revised: 03/05/2012 Document Reviewed: 10/13/2011  ExitCare Patient Information 2014 [employer].

## 2013-11-06 ENCOUNTER — Telehealth: Payer: Self-pay | Admitting: Family Medicine

## 2013-11-06 DIAGNOSIS — M810 Age-related osteoporosis without current pathological fracture: Secondary | ICD-10-CM

## 2013-11-06 NOTE — Telephone Encounter (Signed)
[name] please look at this

## 2013-11-07 ENCOUNTER — Other Ambulatory Visit: Payer: Self-pay | Admitting: General Practice

## 2013-11-08 ENCOUNTER — Other Ambulatory Visit: Payer: Self-pay | Admitting: General Practice

## 2013-11-08 NOTE — Telephone Encounter (Signed)
Please find out who prescribed medication for patient

## 2013-11-10 NOTE — Addendum Note (Signed)
Addended by: Henrene Pastor on: 11/10/2013 09:09 AM   Modules accepted: Orders

## 2013-11-10 NOTE — Telephone Encounter (Signed)
Melinda Ayala was the person who prescribed this for her.

## 2013-11-10 NOTE — Telephone Encounter (Addendum)
Patient started [facility] 09/2011.  Length of treatment for Forteo is 2 years.  Patient has completed Forteo therapy.  She now needs repeat DEXA and will discuss treatment options after that is completed.   Patient aware, [facility] called and order for [facility] sent to [facility].

## 2013-12-04 ENCOUNTER — Ambulatory Visit: Payer: Medicare Other | Admitting: Family Medicine

## 2013-12-10 DIAGNOSIS — Z79899 Other long term (current) drug therapy: Secondary | ICD-10-CM | POA: Diagnosis not present

## 2013-12-10 DIAGNOSIS — I10 Essential (primary) hypertension: Secondary | ICD-10-CM | POA: Diagnosis not present

## 2013-12-10 DIAGNOSIS — G40909 Epilepsy, unspecified, not intractable, without status epilepticus: Secondary | ICD-10-CM | POA: Diagnosis not present

## 2013-12-10 DIAGNOSIS — R569 Unspecified convulsions: Secondary | ICD-10-CM | POA: Diagnosis not present

## 2013-12-17 ENCOUNTER — Encounter: Payer: Self-pay | Admitting: Pharmacist

## 2013-12-17 ENCOUNTER — Ambulatory Visit (INDEPENDENT_AMBULATORY_CARE_PROVIDER_SITE_OTHER): Payer: Medicare Other | Admitting: Pharmacist

## 2013-12-17 ENCOUNTER — Ambulatory Visit (INDEPENDENT_AMBULATORY_CARE_PROVIDER_SITE_OTHER): Payer: Medicare Other

## 2013-12-17 VITALS — Ht 60.0 in | Wt 84.0 lb

## 2013-12-17 DIAGNOSIS — M810 Age-related osteoporosis without current pathological fracture: Secondary | ICD-10-CM | POA: Diagnosis not present

## 2013-12-17 MED ORDER — ALENDRONATE SODIUM 70 MG PO TABS: 70.0000 mg | ORAL_TABLET | ORAL | Status: DC

## 2013-12-17 NOTE — Progress Notes (Signed)
Patient ID: Melinda Ayala, female   DOB: 01/16/1922, 80 y.o.   MRN: [id number] [facility] Current Height: Height: 5' (152.4 cm)      Max Lifetime Height:  5\' 3"  Current Weight: Weight: 84 lb (38.102 kg)       Ethnicity:Caucasian    HPI: Patient with history of osteoporosis here today to recheck DEXA Patient with history of seizures and on antiseizure medications which can affect Vitmamin D and increase risk of osteoporosis and fracture. Back Pain?  Yes       Kyphosis?  Yes Prior fracture?  Yes - ribs Med(s) for Osteoporosis/Osteopenia:  None currently Med(s) previously tried for Osteoporosis/Osteopenia:  Forteo - just finished 2 years 09/2013.                                                             PMH: Age at menopause:  80 yo Hysterectomy?  No Oophorectomy?  No HRT? No Steroid Use?  No Thyroid med?  No History of cancer?  No History of digestive disorders (ie Crohn's)?  No Current or previous eating disorders?  No Last Vitamin D Result:  56 (03/2013) Last GFR Result:  51 (07/2011)   FH/SH: Family history of osteoporosis?  No Parent with history of hip fracture?  No Family history of breast cancer?  Yes - duaghter Exercise?  No Smoking?  No - but dips Alcohol?  No    Calcium Assessment Calcium Intake  # of servings/day  Calcium mg  Milk (8 oz) 2  x  300  = 600mg   Yogurt (4 oz) 0 x  200 = 0  Cheese (1 oz) 0 x  200 = 0  Other Calcium sources   250mg   Ca supplement MVI = 400mg    Estimated calcium intake per day 1250mg     DEXA Results Date of Test T-Score for AP Spine L1-L4 T-Score for Total Left Hip T-Score for Total Right Hip  12/17/2013 -3.7 -4.0 -4.5  10/02/2012 -3.8 -4.1 -4.6  08/30/2011 -4.8 -4.3 -4.7        Assessment: Osteoporosis with increase in BMD with Forteo but is at end of treatment.  Will start new medication for osteoporosis   Recommendations: 1.  Start  alendronate (FOSAMAX) 70mg  1 tablet weekly 2.  continue calcium 1200mg  daily  through supplementation or diet.  3.  recommend weight bearing exercise - 30 minutes at least 4 days per week.   4.  Counseled and educated about fall risk and prevention. 5.  appt made for PCP for follow up and to have labs rechecked  Recheck DEXA:  2 years  Time spent counseling patient:  30 minutes

## 2014-01-05 ENCOUNTER — Encounter: Payer: Self-pay | Admitting: Family Medicine

## 2014-01-05 ENCOUNTER — Ambulatory Visit (INDEPENDENT_AMBULATORY_CARE_PROVIDER_SITE_OTHER): Payer: Medicare Other | Admitting: Family Medicine

## 2014-01-05 VITALS — BP 146/73 | HR 92 | Temp 97.7°F | Ht 61.0 in | Wt 84.4 lb

## 2014-01-05 DIAGNOSIS — G40909 Epilepsy, unspecified, not intractable, without status epilepticus: Secondary | ICD-10-CM

## 2014-01-05 DIAGNOSIS — I498 Other specified cardiac arrhythmias: Secondary | ICD-10-CM

## 2014-01-05 DIAGNOSIS — R9431 Abnormal electrocardiogram [ECG] [EKG]: Secondary | ICD-10-CM | POA: Diagnosis not present

## 2014-01-05 DIAGNOSIS — I10 Essential (primary) hypertension: Secondary | ICD-10-CM

## 2014-01-05 DIAGNOSIS — I471 Supraventricular tachycardia, unspecified: Secondary | ICD-10-CM

## 2014-01-05 DIAGNOSIS — M810 Age-related osteoporosis without current pathological fracture: Secondary | ICD-10-CM

## 2014-01-05 NOTE — Patient Instructions (Signed)
DASH Diet  The DASH diet stands for "Dietary Approaches to Stop Hypertension." It is a healthy eating plan that has been shown to reduce high blood pressure (hypertension) in as little as 14 days, while also possibly providing other significant health benefits. These other health benefits include reducing the risk of breast cancer after menopause and reducing the risk of type 2 diabetes, heart disease, colon cancer, and stroke. Health benefits also include weight loss and slowing kidney failure in patients with chronic kidney disease.   DIET GUIDELINES  · Limit salt (sodium). Your diet should contain less than 1500 mg of sodium daily.  · Limit refined or processed carbohydrates. Your diet should include mostly whole grains. Desserts and added sugars should be used sparingly.  · Include small amounts of heart-healthy fats. These types of fats include nuts, oils, and tub margarine. Limit saturated and trans fats. These fats have been shown to be harmful in the body.  CHOOSING FOODS   The following food groups are based on a 2000 calorie diet. See your Registered Dietitian for individual calorie needs.  Grains and Grain Products (6 to 8 servings daily)  · Eat More Often: Whole-wheat bread, brown rice, whole-grain or wheat pasta, quinoa, popcorn without added fat or salt (air popped).  · Eat Less Often: White bread, white pasta, white rice, cornbread.  Vegetables (4 to 5 servings daily)  · Eat More Often: Fresh, frozen, and canned vegetables. Vegetables may be raw, steamed, roasted, or grilled with a minimal amount of fat.  · Eat Less Often/Avoid: Creamed or fried vegetables. Vegetables in a cheese sauce.  Fruit (4 to 5 servings daily)  · Eat More Often: All fresh, canned (in natural juice), or frozen fruits. Dried fruits without added sugar. One hundred percent fruit juice (½ cup [237 mL] daily).  · Eat Less Often: Dried fruits with added sugar. Canned fruit in light or heavy syrup.  Lean Meats, Fish, and Poultry (2  servings or less daily. One serving is 3 to 4 oz [85-114 g]).  · Eat More Often: Ninety percent or leaner ground beef, tenderloin, sirloin. Round cuts of beef, chicken breast, turkey breast. All fish. Grill, bake, or broil your meat. Nothing should be fried.  · Eat Less Often/Avoid: Fatty cuts of meat, turkey, or chicken leg, thigh, or wing. Fried cuts of meat or fish.  Dairy (2 to 3 servings)  · Eat More Often: Low-fat or fat-free milk, low-fat plain or light yogurt, reduced-fat or part-skim cheese.  · Eat Less Often/Avoid: Milk (whole, 2%). Whole milk yogurt. Full-fat cheeses.  Nuts, Seeds, and Legumes (4 to 5 servings per week)  · Eat More Often: All without added salt.  · Eat Less Often/Avoid: Salted nuts and seeds, canned beans with added salt.  Fats and Sweets (limited)  · Eat More Often: Vegetable oils, tub margarines without trans fats, sugar-free gelatin. Mayonnaise and salad dressings.  · Eat Less Often/Avoid: Coconut oils, palm oils, butter, stick margarine, cream, half and half, cookies, candy, pie.  FOR MORE INFORMATION  The Dash Diet Eating Plan: www.dashdiet.org  Document Released: 08/24/2011 Document Revised: 11/27/2011 Document Reviewed: 08/24/2011  ExitCare® Patient Information ©2014 [employer].

## 2014-01-05 NOTE — Progress Notes (Signed)
Patient ID: Melinda Ayala, female   DOB: 1922/04/18, 80 y.o.   MRN: [id number] SUBJECTIVE: CC: Chief Complaint  Patient presents with  . Follow-up    4 month follow up chronic problems    HPI: Seen by Dr Arsenio Katz last month. F/u in 3 months. Everything is good from neurology  Patient is here for follow up of hypertension: denies Headache;deniesChest Pain;denies weakness;denies Shortness of Breath or Orthopnea;denies Visual changes;denies palpitations;denies cough;denies pedal edema;denies symptoms of TIA or stroke; admits to Compliance with medications. denies Problems with medications.  Osteoporosis: on the forteo. Doing well.   Past Medical History  Diagnosis Date  . Seizures   . Hypertension   . Memory loss    Past Surgical History  Procedure Laterality Date  . Blood clot  1998    on brain    History   Social History  . Marital Status: Widowed    Spouse Name: N/A    Number of Children: N/A  . Years of Education: N/A   Occupational History  . Not on file.   Social History Main Topics  . Smoking status: Former Smoker    Types: Cigarettes    Quit date: 09/18/1974  . Smokeless tobacco: Current User    Types: Snuff     Comment: dips snuff  . Alcohol Use: Not on file  . Drug Use: Not on file  . Sexual Activity: Not on file   Other Topics Concern  . Not on file   Social History Narrative  . No narrative on file   No family history on file. Current Outpatient Prescriptions on File Prior to Visit  Medication Sig Dispense Refill  . alendronate (FOSAMAX) 70 MG tablet Take 1 tablet (70 mg total) by mouth every 7 (seven) days. Take with a full glass of water on an empty stomach.  4 tablet  5  . Iron-Vitamins (GERITOL PO) Take 1 tablet by mouth daily.      Marland Kitchen lisinopril (PRINIVIL,ZESTRIL) 2.5 MG tablet TAKE 1 TABLET BY MOUTH ONCE DAILY  30 tablet  11  . metoprolol (LOPRESSOR) 50 MG tablet TAKE 1/2 TABLET 2 TIMES A DAY  30 tablet  11  . TEGRETOL-XR 200 MG 12 hr  tablet Take 200 mg by mouth 2 (two) times daily.       Marland Kitchen VIMPAT 100 MG TABS Take 100 mg by mouth 2 (two) times daily.        No current facility-administered medications on file prior to visit.   No Known Allergies Immunization History  Administered Date(s) Administered  . Pneumococcal Polysaccharide-23 10/19/2008   Prior to Admission medications   Medication Sig Start Date End Date Taking? Authorizing Provider  alendronate (FOSAMAX) 70 MG tablet Take 1 tablet (70 mg total) by mouth every 7 (seven) days. Take with a full glass of water on an empty stomach. 12/17/13  Yes [name] Eckard, PHARMD  FORTEO 600 MCG/2.4ML SOLN  10/03/13  Yes Historical Provider, MD  Iron-Vitamins (GERITOL PO) Take 1 tablet by mouth daily.   Yes Historical Provider, MD  lisinopril (PRINIVIL,ZESTRIL) 2.5 MG tablet TAKE 1 TABLET BY MOUTH ONCE DAILY 04/17/13  Yes Vernie Shanks, MD  metoprolol (LOPRESSOR) 50 MG tablet TAKE 1/2 TABLET 2 TIMES A DAY 04/17/13  Yes Vernie Shanks, MD  TEGRETOL-XR 200 MG 12 hr tablet Take 200 mg by mouth 2 (two) times daily.  12/24/12  Yes Historical Provider, MD  VIMPAT 100 MG TABS Take 100 mg by mouth 2 (two) times daily.  12/06/12  Yes Historical Provider, MD     ROS: As above in the HPI. All other systems are stable or negative.  OBJECTIVE: APPEARANCE:  Patient in no acute distress.The patient appeared well nourished and normally developed. Acyanotic. Waist: VITAL SIGNS:BP 146/73  Pulse 92  Temp(Src) 97.7 F (36.5 C) (Oral)  Ht _0  (1.549 m)  Wt 84 lb 6.4 oz (38.284 kg)  BMI 15.96 kg/m2  Frail elderly WF Kyphosis  SKIN: warm and  Dry without overt rashes, tattoos and scars  HEAD and Neck: without JVD, Head and scalp: normal Eyes:No scleral icterus. Fundi normal, eye movements normal. Ears: Auricle normal, canal normal, Tympanic membranes normal, insufflation normal. Nose: normal Throat: normal Neck & thyroid: normal  CHEST & LUNGS: Chest wall: normal Lungs:  Clear  [facility]: Reveals the PMI to be normally located. Regular rhythm, First and Second Heart sounds are normal,  absence of murmurs, rubs or gallops. Peripheral vasculature: Radial pulses: normal Dorsal pedis pulses: normal Posterior pulses: normal  ABDOMEN:  Appearance: normal Benign, no organomegaly, no masses, no Abdominal Aortic enlargement. No Guarding , no rebound. No Bruits. Bowel sounds: normal  RECTAL: N/A GU: N/A  EXTREMETIES: nonedematous.  MUSCULOSKELETAL:  Spine: kyphosis Joints: intact  NEUROLOGIC: oriented to,place and person; nonfocal.   ASSESSMENT:  HTN (hypertension) - Plan: CMP14+EGFR, Carbamazepine level, free  SVT (supraventricular tachycardia)  Nonspecific abnormal electrocardiogram (ECG) (EKG)  Seizure disorder - Plan: Carbamazepine level, free  Osteoporosis, senile  PLAN:  Dash diet  Continue care. Establish with new provider here at [facility]. Same medications.  Orders Placed This Encounter  Procedures  . CMP14+EGFR  . Carbamazepine level, free   Meds ordered this encounter  Medications  . FORTEO 600 MCG/2.4ML SOLN    Sig:    There are no discontinued medications. Return in about 3 months (around 04/06/2014) for Recheck medical problems.  Nickia Boesen P. Jacelyn Grip, M.D.

## 2014-01-06 LAB — CMP14+EGFR
ALT: 10 IU/L (ref 0–32)
AST: 17 IU/L (ref 0–40)
Albumin/Globulin Ratio: 1.8 (ref 1.1–2.5)
Albumin: 4.4 g/dL (ref 3.5–4.7)
Alkaline Phosphatase: 64 IU/L (ref 39–117)
BUN/Creatinine Ratio: 13 (ref 11–26)
BUN: 11 mg/dL (ref 8–27)
CO2: 27 mmol/L (ref 18–29)
Calcium: 9.6 mg/dL (ref 8.7–10.3)
Chloride: 100 mmol/L (ref 97–108)
Creatinine, Ser: 0.82 mg/dL (ref 0.57–1.00)
GFR calc Af Amer: 73 mL/min/{1.73_m2} (ref >59)
GFR calc non Af Amer: 64 mL/min/{1.73_m2} (ref >59)
Globulin, Total: 2.4 g/dL (ref 1.5–4.5)
Glucose: 101 mg/dL — ABNORMAL HIGH (ref 65–99)
Potassium: 4.3 mmol/L (ref 3.5–5.2)
Sodium: 140 mmol/L (ref 134–144)
Total Bilirubin: 0.6 mg/dL (ref 0.0–1.2)
Total Protein: 6.8 g/dL (ref 6.0–8.5)

## 2014-01-06 LAB — CARBAMAZEPINE, FREE AND TOTAL: Carbamazepine, Free: 1.6 ug/mL (ref 0.6–4.2)

## 2014-03-31 ENCOUNTER — Telehealth: Payer: Self-pay | Admitting: Family

## 2014-03-31 NOTE — Telephone Encounter (Signed)
appt scheduled

## 2014-04-01 ENCOUNTER — Ambulatory Visit: Payer: Medicare Other | Admitting: Nurse Practitioner

## 2014-04-06 ENCOUNTER — Ambulatory Visit (INDEPENDENT_AMBULATORY_CARE_PROVIDER_SITE_OTHER): Payer: Medicare Other | Admitting: Family

## 2014-04-06 ENCOUNTER — Encounter: Payer: Self-pay | Admitting: Family

## 2014-04-06 VITALS — BP 160/73 | HR 69 | Temp 97.6°F | Ht 61.0 in | Wt 84.4 lb

## 2014-04-06 DIAGNOSIS — G40909 Epilepsy, unspecified, not intractable, without status epilepticus: Secondary | ICD-10-CM | POA: Diagnosis not present

## 2014-04-06 DIAGNOSIS — M810 Age-related osteoporosis without current pathological fracture: Secondary | ICD-10-CM

## 2014-04-06 DIAGNOSIS — Z130 Encounter for screening for diseases of the blood and blood-forming organs and certain disorders involving the immune mechanism: Secondary | ICD-10-CM | POA: Diagnosis not present

## 2014-04-06 DIAGNOSIS — I10 Essential (primary) hypertension: Secondary | ICD-10-CM | POA: Diagnosis not present

## 2014-04-06 DIAGNOSIS — Z1321 Encounter for screening for nutritional disorder: Secondary | ICD-10-CM

## 2014-04-06 NOTE — Patient Instructions (Addendum)
Health Maintenance, Female A healthy lifestyle and preventative care can promote health and wellness.  Maintain regular health, dental, and eye exams.  Eat a healthy diet. Foods like vegetables, fruits, whole grains, low-fat dairy products, and lean protein foods contain the nutrients you need without too many calories. Decrease your intake of foods high in solid fats, added sugars, and salt. Get information about a proper diet from your caregiver, if necessary.  Regular physical exercise is one of the most important things you can do for your health. Most adults should get at least 150 minutes of moderate-intensity exercise (any activity that increases your heart rate and causes you to sweat) each week. In addition, most adults need muscle-strengthening exercises on 2 or more days a week.   Maintain a healthy weight. The body mass index (BMI) is a screening tool to identify possible weight problems. It provides an estimate of body fat based on height and weight. Your caregiver can help determine your BMI, and can help you achieve or maintain a healthy weight. For adults 20 years and older:  A BMI below 18.5 is considered underweight.  A BMI of 18.5 to 24.9 is normal.  A BMI of 25 to 29.9 is considered overweight.  A BMI of 30 and above is considered obese.  Maintain normal blood lipids and cholesterol by exercising and minimizing your intake of saturated fat. Eat a balanced diet with plenty of fruits and vegetables. Blood tests for lipids and cholesterol should begin at age 41 and be repeated every 5 years. If your lipid or cholesterol levels are high, you are over 50, or you are a high risk for heart disease, you may need your cholesterol levels checked more frequently.Ongoing high lipid and cholesterol levels should be treated with medicines if diet and exercise are not effective.  If you smoke, find out from your caregiver how to quit. If you do not use tobacco, do not start.  Lung  cancer screening is recommended for adults aged 66-80 years who are at high risk for developing lung cancer because of a history of smoking. Yearly low-dose computed tomography (CT) is recommended for people who have at least a 30-pack-year history of smoking and are a current smoker or have quit within the past 15 years. A pack year of smoking is smoking an average of 1 pack of cigarettes a day for 1 year (for example: 1 pack a day for 30 years or 2 packs a day for 15 years). Yearly screening should continue until the smoker has stopped smoking for at least 15 years. Yearly screening should also be stopped for people who develop a health problem that would prevent them from having lung cancer treatment.  If you are pregnant, do not drink alcohol. If you are breastfeeding, be very cautious about drinking alcohol. If you are not pregnant and choose to drink alcohol, do not exceed 1 drink per day. One drink is considered to be 12 ounces (355 mL) of beer, 5 ounces (148 mL) of wine, or 1.5 ounces (44 mL) of liquor.  Avoid use of street drugs. Do not share needles with anyone. Ask for help if you need support or instructions about stopping the use of drugs.  High blood pressure causes heart disease and increases the risk of stroke. Blood pressure should be checked at least every 1 to 2 years. Ongoing high blood pressure should be treated with medicines, if weight loss and exercise are not effective.  If you are 55 to 78  years old, ask your caregiver if you should take aspirin to prevent strokes.  Diabetes screening involves taking a blood sample to check your fasting blood sugar level. This should be done once every 3 years, after age 45, if you are within normal weight and without risk factors for diabetes. Testing should be considered at a younger age or be carried out more frequently if you are overweight and have at least 1 risk factor for diabetes.  Breast cancer screening is essential preventative care  for women. You should practice "breast self-awareness." This means understanding the normal appearance and feel of your breasts and may include breast self-examination. Any changes detected, no matter how small, should be reported to a caregiver. Women in their 20s and 30s should have a clinical breast exam (CBE) by a caregiver as part of a regular health exam every 1 to 3 years. After age 40, women should have a CBE every year. Starting at age 40, women should consider having a mammogram (breast X-ray) every year. Women who have a family history of breast cancer should talk to their caregiver about genetic screening. Women at a high risk of breast cancer should talk to their caregiver about having an MRI and a mammogram every year.  Breast cancer gene (BRCA)-related cancer risk assessment is recommended for women who have family members with BRCA-related cancers. BRCA-related cancers include breast, ovarian, tubal, and peritoneal cancers. Having family members with these cancers may be associated with an increased risk for harmful changes (mutations) in the breast cancer genes BRCA1 and BRCA2. Results of the assessment will determine the need for genetic counseling and BRCA1 and BRCA2 testing.  The Pap test is a screening test for cervical cancer. Women should have a Pap test starting at age 21. Between ages 21 and 29, Pap tests should be repeated every 2 years. Beginning at age 30, you should have a Pap test every 3 years as long as the past 3 Pap tests have been normal. If you had a hysterectomy for a problem that was not cancer or a condition that could lead to cancer, then you no longer need Pap tests. If you are between ages 65 and 70, and you have had normal Pap tests going back 10 years, you no longer need Pap tests. If you have had past treatment for cervical cancer or a condition that could lead to cancer, you need Pap tests and screening for cancer for at least 20 years after your treatment. If Pap  tests have been discontinued, risk factors (such as a new sexual partner) need to be reassessed to determine if screening should be resumed. Some women have medical problems that increase the chance of getting cervical cancer. In these cases, your caregiver may recommend more frequent screening and Pap tests.  The human papillomavirus (HPV) test is an additional test that may be used for cervical cancer screening. The HPV test looks for the virus that can cause the cell changes on the cervix. The cells collected during the Pap test can be tested for HPV. The HPV test could be used to screen women aged 30 years and older, and should be used in women of any age who have unclear Pap test results. After the age of 30, women should have HPV testing at the same frequency as a Pap test.  Colorectal cancer can be detected and often prevented. Most routine colorectal cancer screening begins at the age of 50 and continues through age 75. However, your caregiver may   recommend screening at an earlier age if you have risk factors for colon cancer. On a yearly basis, your caregiver may provide home test kits to check for hidden blood in the stool. Use of a small camera at the end of a tube, to directly examine the colon (sigmoidoscopy or colonoscopy), can detect the earliest forms of colorectal cancer. Talk to your caregiver about this at age 63, when routine screening begins. Direct examination of the colon should be repeated every 5 to 10 years through age 24, unless early forms of pre-cancerous polyps or small growths are found.  Hepatitis C blood testing is recommended for all people born from [birth year] through 1963 and any individual with known risks for hepatitis C.  Practice safe sex. Use condoms and avoid high-risk sexual practices to reduce the spread of sexually transmitted infections (STIs). Sexually active women aged 24 and younger should be checked for Chlamydia, which is a common sexually transmitted infection.  Older women with new or multiple partners should also be tested for Chlamydia. Testing for other STIs is recommended if you are sexually active and at increased risk.  Osteoporosis is a disease in which the bones lose minerals and strength with aging. This can result in serious bone fractures. The risk of osteoporosis can be identified using a bone density scan. Women ages 26 and over and women at risk for fractures or osteoporosis should discuss screening with their caregivers. Ask your caregiver whether you should be taking a calcium supplement or vitamin D to reduce the rate of osteoporosis.  Menopause can be associated with physical symptoms and risks. Hormone replacement therapy is available to decrease symptoms and risks. You should talk to your caregiver about whether hormone replacement therapy is right for you.  Use sunscreen. Apply sunscreen liberally and repeatedly throughout the day. You should seek shade when your shadow is shorter than you. Protect yourself by wearing long sleeves, pants, a wide-brimmed hat, and sunglasses year round, whenever you are outdoors.  Notify your caregiver of new moles or changes in moles, especially if there is a change in shape or color. Also notify your caregiver if a mole is larger than the size of a pencil eraser.  Stay current with your immunizations. Document Released: 03/20/2011 Document Revised: 12/30/2012 Document Reviewed: 08/06/2013 [facility] Patient Information 2015 [place], [place]. This information is not intended to replace advice given to you by your health care provider. Make sure you discuss any questions you have with your health care provider. Fall Prevention and Home Safety Falls cause injuries and can affect all age groups. It is possible to prevent falls.  HOW TO PREVENT FALLS  Wear shoes with rubber soles that do not have an opening for your toes.  Keep the inside and outside of your house well lit.  Use night lights throughout  your home.  Remove clutter from floors.  Clean up floor spills.  Remove throw rugs or fasten them to the floor with carpet tape.  Do not place electrical cords across pathways.  Put grab bars by your tub, shower, and toilet. Do not use towel bars as grab bars.  Put handrails on both sides of the stairway. Fix loose handrails.  Do not climb on stools or stepladders, if possible.  Do not wax your floors.  Repair uneven or unsafe sidewalks, walkways, or stairs.  Keep items you use a lot within reach.  Be aware of pets.  Keep emergency numbers next to the telephone.  Put smoke detectors in your  home and near bedrooms. Ask your doctor what other things you can do to prevent falls. Document Released: 07/01/2009 Document Revised: 03/05/2012 Document Reviewed: 12/05/2011 [facility] Patient Information 2015 [place], [place]. This information is not intended to replace advice given to you by your health care provider. Make sure you discuss any questions you have with your health care provider.

## 2014-04-06 NOTE — Progress Notes (Signed)
   Subjective:    Patient ID: Melinda Ayala, female    DOB: 01/30/1922, 80 y.o.   MRN: [id number]  Hypertension This is a chronic problem. The current episode started more than 1 year ago. The problem has been waxing and waning since onset. The problem is uncontrolled. Pertinent negatives include no anxiety, headaches, malaise/fatigue, palpitations, peripheral edema or shortness of breath. Risk factors for coronary artery disease include family history and post-menopausal state. Past treatments include ACE inhibitors and beta blockers. The current treatment provides moderate improvement. There is no history of kidney disease, CAD/MI, heart failure or a thyroid problem. There is no history of sleep apnea.  Osteoporosis Pt currently taking fosamax weekly. Pt states her last Dexascan was 3 months ago.  Seizures Pt currently taking tegetol xr and vimpat BID. Pt has follow-up appointment with neurologists on 04/09/14.     Review of Systems  Constitutional: Negative.  Negative for malaise/fatigue.  HENT: Negative.   Eyes: Negative.   Respiratory: Negative.  Negative for shortness of breath.   Cardiovascular: Negative.  Negative for palpitations.  Gastrointestinal: Negative.   Endocrine: Negative.   Genitourinary: Negative.   Musculoskeletal: Negative.   Neurological: Positive for dizziness (At times, when pt stands up too fast). Negative for headaches.  Hematological: Negative.   Psychiatric/Behavioral: Negative.   All other systems reviewed and are negative.      Objective:   Physical Exam  Vitals reviewed. Constitutional: She is oriented to person, place, and time. She appears well-developed and well-nourished. No distress.  HENT:  Head: Normocephalic and atraumatic.  Right Ear: External ear normal.  Mouth/Throat: Oropharynx is clear and moist.  Eyes: Pupils are equal, round, and reactive to light.  Neck: Normal range of motion. Neck supple. No thyromegaly present.    Cardiovascular: Normal rate, regular rhythm, normal heart sounds and intact distal pulses.   No murmur heard. Pulmonary/Chest: Effort normal and breath sounds normal. No respiratory distress. She has no wheezes.  Abdominal: Soft. Bowel sounds are normal. She exhibits no distension. There is no tenderness.  Musculoskeletal: Normal range of motion. She exhibits no edema and no tenderness.  Neurological: She is alert and oriented to person, place, and time. She has normal reflexes. No cranial nerve deficit.  Skin: Skin is warm and dry.  Psychiatric: She has a normal mood and affect. Her behavior is normal. Judgment and thought content normal.     BP 160/73  Pulse 69  Temp(Src) 97.6 F (36.4 C) (Oral)  Ht $R'5\' 1"'xn$  (1.549 m)  Wt 84 lb 6.4 oz (38.284 kg)  BMI 15.96 kg/m2      Assessment & Plan:  1. Essential hypertension - CMP14+EGFR  2. Seizure disorder  3. Osteoporosis, senile  4. Encounter for vitamin deficiency screening - Vit D  25 hydroxy (rtn osteoporosis monitoring)   Continue all meds Labs pending Health Maintenance reviewed Falls precaution discussed Diet and exercise encouraged RTO 3 months  Evelina Dun, FNP

## 2014-04-07 LAB — CMP14+EGFR
ALBUMIN: 4.4 g/dL (ref 3.5–4.7)
ALK PHOS: 58 IU/L (ref 39–117)
ALT: 7 IU/L (ref 0–32)
AST: 18 IU/L (ref 0–40)
Albumin/Globulin Ratio: 1.7 (ref 1.1–2.5)
BUN / CREAT RATIO: 13 (ref 11–26)
BUN: 12 mg/dL (ref 8–27)
CHLORIDE: 99 mmol/L (ref 97–108)
CO2: 25 mmol/L (ref 18–29)
Calcium: 10.1 mg/dL (ref 8.7–10.3)
Creatinine, Ser: 0.95 mg/dL (ref 0.57–1.00)
GFR calc Af Amer: 61 mL/min/{1.73_m2} (ref >59)
GFR calc non Af Amer: 53 mL/min/{1.73_m2} — ABNORMAL LOW (ref >59)
GLUCOSE: 91 mg/dL (ref 65–99)
Globulin, Total: 2.6 g/dL (ref 1.5–4.5)
POTASSIUM: 4.3 mmol/L (ref 3.5–5.2)
Sodium: 139 mmol/L (ref 134–144)
Total Bilirubin: 0.4 mg/dL (ref 0.0–1.2)
Total Protein: 7 g/dL (ref 6.0–8.5)

## 2014-04-07 LAB — VITAMIN D 25 HYDROXY (VIT D DEFICIENCY, FRACTURES): VIT D 25 HYDROXY: 34.6 ng/mL (ref 30.0–100.0)

## 2014-04-09 DIAGNOSIS — I10 Essential (primary) hypertension: Secondary | ICD-10-CM | POA: Diagnosis not present

## 2014-04-09 DIAGNOSIS — R269 Unspecified abnormalities of gait and mobility: Secondary | ICD-10-CM | POA: Diagnosis not present

## 2014-04-09 DIAGNOSIS — G40909 Epilepsy, unspecified, not intractable, without status epilepticus: Secondary | ICD-10-CM | POA: Diagnosis not present

## 2014-05-18 ENCOUNTER — Other Ambulatory Visit: Payer: Self-pay | Admitting: *Deleted

## 2014-05-18 MED ORDER — LISINOPRIL 2.5 MG PO TABS: ORAL_TABLET | ORAL | Status: DC

## 2014-05-18 MED ORDER — METOPROLOL TARTRATE 50 MG PO TABS: ORAL_TABLET | ORAL | Status: DC

## 2014-06-11 DIAGNOSIS — Z23 Encounter for immunization: Secondary | ICD-10-CM | POA: Diagnosis not present

## 2014-07-03 ENCOUNTER — Other Ambulatory Visit: Payer: Self-pay | Admitting: Family

## 2014-07-12 ENCOUNTER — Other Ambulatory Visit: Payer: Self-pay | Admitting: Family Medicine

## 2014-07-23 ENCOUNTER — Other Ambulatory Visit: Payer: Self-pay | Admitting: Family Medicine

## 2014-09-27 ENCOUNTER — Other Ambulatory Visit: Payer: Self-pay | Admitting: Family

## 2014-10-10 ENCOUNTER — Other Ambulatory Visit: Payer: Self-pay | Admitting: Family Medicine

## 2014-10-12 ENCOUNTER — Other Ambulatory Visit: Payer: Self-pay | Admitting: Family Medicine

## 2014-10-13 DIAGNOSIS — S9032XA Contusion of left foot, initial encounter: Secondary | ICD-10-CM | POA: Diagnosis not present

## 2014-10-13 DIAGNOSIS — L0390 Cellulitis, unspecified: Secondary | ICD-10-CM | POA: Diagnosis not present

## 2014-10-13 DIAGNOSIS — M79675 Pain in left toe(s): Secondary | ICD-10-CM | POA: Diagnosis not present

## 2014-10-22 ENCOUNTER — Other Ambulatory Visit: Payer: Self-pay | Admitting: Family Medicine

## 2014-10-29 ENCOUNTER — Other Ambulatory Visit: Payer: Self-pay | Admitting: Family

## 2014-11-04 ENCOUNTER — Ambulatory Visit (INDEPENDENT_AMBULATORY_CARE_PROVIDER_SITE_OTHER): Payer: Medicare Other | Admitting: Family

## 2014-11-04 ENCOUNTER — Encounter: Payer: Self-pay | Admitting: Family

## 2014-11-04 VITALS — BP 145/71 | HR 80 | Temp 97.1°F | Ht 61.0 in | Wt 86.4 lb

## 2014-11-04 DIAGNOSIS — I10 Essential (primary) hypertension: Secondary | ICD-10-CM | POA: Diagnosis not present

## 2014-11-04 DIAGNOSIS — G40909 Epilepsy, unspecified, not intractable, without status epilepticus: Secondary | ICD-10-CM

## 2014-11-04 DIAGNOSIS — M810 Age-related osteoporosis without current pathological fracture: Secondary | ICD-10-CM

## 2014-11-04 NOTE — Patient Instructions (Signed)

## 2014-11-04 NOTE — Progress Notes (Signed)
   Subjective:    Patient ID: Melinda Ayala, female    DOB: [birth year], 81 y.o.   MRN: [id number]  Hypertension This is a chronic problem. The current episode started more than 1 year ago. The problem has been waxing and waning since onset. The problem is controlled. Pertinent negatives include no anxiety, headaches, palpitations, peripheral edema or shortness of breath. Risk factors for coronary artery disease include post-menopausal state. Past treatments include ACE inhibitors and beta blockers. The current treatment provides significant improvement. There is no history of kidney disease, CAD/MI, CVA, heart failure or a thyroid problem. There is no history of sleep apnea.  Seizures Pt currently taking tegretol and vimpat daily. She is followed by Dr. Dicky Doe neurologists every 3 months.     Review of Systems  Constitutional: Negative.   HENT: Negative.   Eyes: Negative.   Respiratory: Negative.  Negative for shortness of breath.   Cardiovascular: Negative.  Negative for palpitations.  Gastrointestinal: Negative.   Endocrine: Negative.   Genitourinary: Negative.   Musculoskeletal: Negative.   Neurological: Negative.  Negative for headaches.  Hematological: Negative.   Psychiatric/Behavioral: Negative.   All other systems reviewed and are negative.      Objective:   Physical Exam  Constitutional: She is oriented to person, place, and time. She appears well-developed and well-nourished. No distress.  HENT:  Head: Normocephalic and atraumatic.  Right Ear: External ear normal.  Left Ear: External ear normal.  Nose: Nose normal.  Mouth/Throat: Oropharynx is clear and moist.  Eyes: Pupils are equal, round, and reactive to light.  Neck: Normal range of motion. Neck supple. No thyromegaly present.  Cardiovascular: Normal rate, regular rhythm, normal heart sounds and intact distal pulses.   No murmur heard. Pulmonary/Chest: Effort normal and breath sounds normal. No respiratory  distress. She has no wheezes.  Abdominal: Soft. Bowel sounds are normal. She exhibits no distension. There is no tenderness.  Musculoskeletal: Normal range of motion. She exhibits no edema or tenderness.  Neurological: She is alert and oriented to person, place, and time. She has normal reflexes. No cranial nerve deficit.  Skin: Skin is warm and dry.  Scab on left great toe  Psychiatric: She has a normal mood and affect. Her behavior is normal. Judgment and thought content normal.  Vitals reviewed.   BP 145/71 mmHg  Pulse 80  Temp(Src) 97.1 F (36.2 C) (Oral)  Ht $R'5\' 1"'qs$  (1.549 m)  Wt 86 lb 6.4 oz (39.191 kg)  BMI 16.33 kg/m2       Assessment & Plan:  1. Essential hypertension - CMP14+EGFR  2. Osteoporosis, senile - CMP14+EGFR  3. Seizure disorder - CMP14+EGFR   Continue all meds Labs pending Health Maintenance reviewed Fall precaution discussed Diet and exercise encouraged RTO 6 months  Evelina Dun, FNP

## 2014-11-05 ENCOUNTER — Other Ambulatory Visit: Payer: Self-pay | Admitting: Family

## 2014-11-05 LAB — CMP14+EGFR
ALT: 7 IU/L (ref 0–32)
AST: 18 IU/L (ref 0–40)
Albumin/Globulin Ratio: 1.7 (ref 1.1–2.5)
Albumin: 4.5 g/dL (ref 3.2–4.6)
Alkaline Phosphatase: 57 IU/L (ref 39–117)
BUN/Creatinine Ratio: 14 (ref 11–26)
BUN: 11 mg/dL (ref 10–36)
Bilirubin Total: 0.4 mg/dL (ref 0.0–1.2)
CALCIUM: 9.8 mg/dL (ref 8.7–10.3)
CHLORIDE: 101 mmol/L (ref 97–108)
CO2: 25 mmol/L (ref 18–29)
Creatinine, Ser: 0.78 mg/dL (ref 0.57–1.00)
GFR calc Af Amer: 77 mL/min/{1.73_m2} (ref >59)
GFR, EST NON AFRICAN AMERICAN: 67 mL/min/{1.73_m2} (ref >59)
GLUCOSE: 102 mg/dL — AB (ref 65–99)
Globulin, Total: 2.6 g/dL (ref 1.5–4.5)
POTASSIUM: 3.8 mmol/L (ref 3.5–5.2)
SODIUM: 143 mmol/L (ref 134–144)
TOTAL PROTEIN: 7.1 g/dL (ref 6.0–8.5)

## 2014-11-17 DIAGNOSIS — I10 Essential (primary) hypertension: Secondary | ICD-10-CM | POA: Diagnosis not present

## 2014-11-17 DIAGNOSIS — R2681 Unsteadiness on feet: Secondary | ICD-10-CM | POA: Diagnosis not present

## 2014-11-17 DIAGNOSIS — Z79899 Other long term (current) drug therapy: Secondary | ICD-10-CM | POA: Diagnosis not present

## 2014-11-17 DIAGNOSIS — G40909 Epilepsy, unspecified, not intractable, without status epilepticus: Secondary | ICD-10-CM | POA: Diagnosis not present

## 2014-11-26 ENCOUNTER — Other Ambulatory Visit: Payer: Self-pay | Admitting: Nurse Practitioner

## 2014-12-21 ENCOUNTER — Other Ambulatory Visit: Payer: Self-pay | Admitting: Nurse Practitioner

## 2014-12-21 ENCOUNTER — Other Ambulatory Visit: Payer: Self-pay | Admitting: Family

## 2014-12-21 ENCOUNTER — Other Ambulatory Visit: Payer: Self-pay | Admitting: Family Medicine

## 2015-02-26 ENCOUNTER — Encounter: Payer: Self-pay | Admitting: *Deleted

## 2015-04-07 ENCOUNTER — Other Ambulatory Visit: Payer: Self-pay | Admitting: Nurse Practitioner

## 2015-04-10 ENCOUNTER — Other Ambulatory Visit: Payer: Self-pay | Admitting: Family

## 2015-05-04 ENCOUNTER — Encounter: Payer: Self-pay | Admitting: Family

## 2015-05-04 ENCOUNTER — Ambulatory Visit (INDEPENDENT_AMBULATORY_CARE_PROVIDER_SITE_OTHER): Payer: Medicare Other | Admitting: Family

## 2015-05-04 VITALS — BP 146/69 | HR 62 | Temp 96.9°F | Ht 61.0 in | Wt 83.8 lb

## 2015-05-04 DIAGNOSIS — I10 Essential (primary) hypertension: Secondary | ICD-10-CM

## 2015-05-04 DIAGNOSIS — G40909 Epilepsy, unspecified, not intractable, without status epilepticus: Secondary | ICD-10-CM | POA: Diagnosis not present

## 2015-05-04 DIAGNOSIS — Z23 Encounter for immunization: Secondary | ICD-10-CM | POA: Diagnosis not present

## 2015-05-04 DIAGNOSIS — M810 Age-related osteoporosis without current pathological fracture: Secondary | ICD-10-CM | POA: Diagnosis not present

## 2015-05-04 NOTE — Progress Notes (Signed)
   Subjective:    Patient ID: Melinda Ayala, female    DOB: October 02, 1921, 81 y.o.   MRN: [id number]  Pt presents to the office today for chronic follow up.  Hypertension This is a chronic problem. The current episode started more than 1 year ago. The problem has been waxing and waning since onset. The problem is uncontrolled. Pertinent negatives include no anxiety, headaches, palpitations, peripheral edema or shortness of breath. Risk factors for coronary artery disease include post-menopausal state and smoking/tobacco exposure. Past treatments include ACE inhibitors and beta blockers. The current treatment provides significant improvement. There is no history of kidney disease, CAD/MI, CVA, heart failure or a thyroid problem. There is no history of sleep apnea.  Seizures Pt currently taking tegretol and vimpat daily. She is followed by Dr. Dicky Doe neurologists every 3 months. Pt states it has been "awhile" since her last seizure and states they are well controlled.     Review of Systems  Constitutional: Negative.   HENT: Negative.   Eyes: Negative.   Respiratory: Negative.  Negative for shortness of breath.   Cardiovascular: Negative.  Negative for palpitations.  Gastrointestinal: Negative.   Endocrine: Negative.   Genitourinary: Negative.   Musculoskeletal: Negative.   Neurological: Negative.  Negative for headaches.  Hematological: Negative.   Psychiatric/Behavioral: Negative.   All other systems reviewed and are negative.      Objective:   Physical Exam  Constitutional: She is oriented to person, place, and time. She appears well-developed and well-nourished. No distress.  HENT:  Head: Normocephalic and atraumatic.  Right Ear: External ear normal.  Left Ear: External ear normal.  Nose: Nose normal.  Mouth/Throat: Oropharynx is clear and moist.  Eyes: Pupils are equal, round, and reactive to light.  Neck: Normal range of motion. Neck supple. No thyromegaly present.    Cardiovascular: Normal rate, regular rhythm, normal heart sounds and intact distal pulses.   No murmur heard. Pulmonary/Chest: Effort normal and breath sounds normal. No respiratory distress. She has no wheezes.  Abdominal: Soft. Bowel sounds are normal. She exhibits no distension. There is no tenderness.  Musculoskeletal: Normal range of motion. She exhibits no edema or tenderness.  Neurological: She is alert and oriented to person, place, and time. She has normal reflexes. No cranial nerve deficit.  Skin: Skin is warm and dry.  Psychiatric: She has a normal mood and affect. Her behavior is normal. Judgment and thought content normal.  Vitals reviewed.  BP 146/69 mmHg  Pulse 62  Temp(Src) 96.9 F (36.1 C) (Oral)  Ht 5' 1" (1.549 m)  Wt 83 lb 12.8 oz (38.011 kg)  BMI 15.84 kg/m2       Assessment & Plan:  1. Essential hypertension - CMP14+EGFR  2. Osteoporosis, senile - CMP14+EGFR  3. Seizure disorder - CMP14+EGFR   Continue all meds Labs pending Health Maintenance reviewed-Prevnar given today Diet and exercise encouraged RTO 6 months   Evelina Dun, FNP

## 2015-05-04 NOTE — Addendum Note (Signed)
Addended by: Almeta Monas on: 05/04/2015 09:47 AM   Modules accepted: Orders

## 2015-05-04 NOTE — Patient Instructions (Signed)

## 2015-05-05 ENCOUNTER — Ambulatory Visit: Payer: Medicare Other | Admitting: Family

## 2015-05-05 LAB — CMP14+EGFR
A/G RATIO: 1.7 (ref 1.1–2.5)
ALBUMIN: 4.3 g/dL (ref 3.2–4.6)
ALT: 9 IU/L (ref 0–32)
AST: 16 IU/L (ref 0–40)
Alkaline Phosphatase: 55 IU/L (ref 39–117)
BILIRUBIN TOTAL: 0.4 mg/dL (ref 0.0–1.2)
BUN / CREAT RATIO: 12 (ref 11–26)
BUN: 10 mg/dL (ref 10–36)
CALCIUM: 9.6 mg/dL (ref 8.7–10.3)
CHLORIDE: 102 mmol/L (ref 97–108)
CO2: 25 mmol/L (ref 18–29)
Creatinine, Ser: 0.83 mg/dL (ref 0.57–1.00)
GFR calc Af Amer: 72 mL/min/{1.73_m2} (ref >59)
GFR, EST NON AFRICAN AMERICAN: 62 mL/min/{1.73_m2} (ref >59)
Globulin, Total: 2.5 g/dL (ref 1.5–4.5)
Glucose: 83 mg/dL (ref 65–99)
POTASSIUM: 4.1 mmol/L (ref 3.5–5.2)
Sodium: 143 mmol/L (ref 134–144)
TOTAL PROTEIN: 6.8 g/dL (ref 6.0–8.5)

## 2015-05-08 ENCOUNTER — Other Ambulatory Visit: Payer: Self-pay | Admitting: Family

## 2015-05-18 DIAGNOSIS — M818 Other osteoporosis without current pathological fracture: Secondary | ICD-10-CM | POA: Diagnosis not present

## 2015-05-18 DIAGNOSIS — G40909 Epilepsy, unspecified, not intractable, without status epilepticus: Secondary | ICD-10-CM | POA: Diagnosis not present

## 2015-05-18 DIAGNOSIS — I10 Essential (primary) hypertension: Secondary | ICD-10-CM | POA: Diagnosis not present

## 2015-05-18 DIAGNOSIS — R2681 Unsteadiness on feet: Secondary | ICD-10-CM | POA: Diagnosis not present

## 2015-05-18 DIAGNOSIS — R5383 Other fatigue: Secondary | ICD-10-CM | POA: Diagnosis not present

## 2015-05-18 DIAGNOSIS — E559 Vitamin D deficiency, unspecified: Secondary | ICD-10-CM | POA: Diagnosis not present

## 2015-05-18 DIAGNOSIS — Z79899 Other long term (current) drug therapy: Secondary | ICD-10-CM | POA: Diagnosis not present

## 2015-05-18 DIAGNOSIS — E538 Deficiency of other specified B group vitamins: Secondary | ICD-10-CM | POA: Diagnosis not present

## 2015-06-02 ENCOUNTER — Other Ambulatory Visit: Payer: Self-pay | Admitting: Family

## 2015-06-17 ENCOUNTER — Encounter: Payer: Self-pay | Admitting: Family Medicine

## 2015-06-17 ENCOUNTER — Ambulatory Visit (INDEPENDENT_AMBULATORY_CARE_PROVIDER_SITE_OTHER): Payer: Medicare Other | Admitting: Family Medicine

## 2015-06-17 VITALS — BP 213/91 | HR 99 | Temp 97.6°F | Ht 61.0 in

## 2015-06-17 DIAGNOSIS — S51801A Unspecified open wound of right forearm, initial encounter: Secondary | ICD-10-CM

## 2015-06-17 DIAGNOSIS — IMO0002 Reserved for concepts with insufficient information to code with codable children: Secondary | ICD-10-CM

## 2015-06-17 DIAGNOSIS — S50911A Unspecified superficial injury of right forearm, initial encounter: Secondary | ICD-10-CM

## 2015-06-17 NOTE — Progress Notes (Signed)
BP 213/91 mmHg  Pulse 99  Temp(Src) 97.6 F (36.4 C) (Oral)  Ht '5\' 1"'  (1.549 m)   Subjective:    Patient ID: Melinda Ayala, female    DOB: [birth year], 81 y.o.   MRN: [id number]  HPI: Melinda Ayala is a 81 y.o. female presenting on 06/17/2015 for Laceration   HPI Laceration Patient got her wound or reaching behind her couch and then bumped her arm against the back of the couch. The wound is on her right posterior forearm. She is not on any blood thinners unless blood some but not anything profuse, she just has very thin skin and has had that for some time.  Relevant past medical, surgical, family and social history reviewed and updated as indicated. Interim medical history since our last visit reviewed. Allergies and medications reviewed and updated.  Review of Systems  Constitutional: Negative for fever and chills.  HENT: Negative for congestion, ear discharge and ear pain.   Eyes: Negative for redness and visual disturbance.  Respiratory: Negative for chest tightness and shortness of breath.   Cardiovascular: Negative for chest pain and leg swelling.  Genitourinary: Negative for dysuria and difficulty urinating.  Musculoskeletal: Negative for back pain and gait problem.  Skin: Positive for wound. Negative for rash.  Neurological: Negative for light-headedness and headaches.  Psychiatric/Behavioral: Negative for behavioral problems and agitation.  All other systems reviewed and are negative.   Per HPI unless specifically indicated above     Medication List       This list is accurate as of: 06/17/15 12:58 PM.  Always use your most recent med list.               alendronate 70 MG tablet  Commonly known as:  FOSAMAX  TAKE 1 TABLET BY MOUTH EVERY 7 DAYS WITH FULL GLASS OF WATER ON AN EMPTY STOMACH     GERITOL PO  Take 1 tablet by mouth daily.     lisinopril 2.5 MG tablet  Commonly known as:  PRINIVIL,ZESTRIL  TAKE 1 TABLET BY MOUTH ONCE DAILY     metoprolol  50 MG tablet  Commonly known as:  LOPRESSOR     TEGRETOL-XR 200 MG 12 hr tablet  Generic drug:  carbamazepine  Take 200 mg by mouth 2 (two) times daily.     VIMPAT 100 MG Tabs  Generic drug:  Lacosamide  Take 100 mg by mouth 2 (two) times daily.           Objective:    BP 213/91 mmHg  Pulse 99  Temp(Src) 97.6 F (36.4 C) (Oral)  Ht '5\' 1"'  (1.549 m)  Wt Readings from Last 3 Encounters:  05/04/15 83 lb 12.8 oz (38.011 kg)  11/04/14 86 lb 6.4 oz (39.191 kg)  04/06/14 84 lb 6.4 oz (38.284 kg)    Physical Exam  Constitutional: She is oriented to person, place, and time. She appears well-developed and well-nourished. No distress.  Eyes: Conjunctivae and EOM are normal. Pupils are equal, round, and reactive to light.  Cardiovascular: Normal rate and regular rhythm.   No murmur heard. Pulmonary/Chest: Effort normal and breath sounds normal. No respiratory distress. She has no wheezes.  Musculoskeletal: Normal range of motion. She exhibits no edema or tenderness.  Neurological: She is alert and oriented to person, place, and time. Coordination normal.  Skin: Skin is warm and dry. No rash noted. She is not diaphoretic.     Psychiatric: She has a normal mood and affect. Her  behavior is normal.  Vitals reviewed.   Results for orders placed or performed in visit on 05/04/15  CMP14+EGFR  Result Value Ref Range   Glucose 83 65 - 99 mg/dL   BUN 10 10 - 36 mg/dL   Creatinine, Ser 0.83 0.57 - 1.00 mg/dL   GFR calc non Af Amer 62 >59 mL/min/1.73   GFR calc Af Amer 72 >59 mL/min/1.73   BUN/Creatinine Ratio 12 11 - 26   Sodium 143 134 - 144 mmol/L   Potassium 4.1 3.5 - 5.2 mmol/L   Chloride 102 97 - 108 mmol/L   CO2 25 18 - 29 mmol/L   Calcium 9.6 8.7 - 10.3 mg/dL   Total Protein 6.8 6.0 - 8.5 g/dL   Albumin 4.3 3.2 - 4.6 g/dL   Globulin, Total 2.5 1.5 - 4.5 g/dL   Albumin/Globulin Ratio 1.7 1.1 - 2.5   Bilirubin Total 0.4 0.0 - 1.2 mg/dL   Alkaline Phosphatase 55 39 - 117 IU/L    AST 16 0 - 40 IU/L   ALT 9 0 - 32 IU/L   Laceration repair: Wound was irrigated with normal saline at pressure. 2% lidocaine with epinephrine was used for local anesthesia, 6 mL. 3-0 Monocryl was used to repair the wound.  Placed 12 sutures. Wound was approximated well and topical antibiotic was used and then it was covered by 4 x 4 and tape told in place. Procedure was tolerated well    Assessment & Plan:       Problem List Items Addressed This Visit    None    Visit Diagnoses    Laceration    -  Primary    of right arm, placed 12 sutures, place abx ointment and gauze, return in 10 days for suture removal        Follow up plan: Return in about 10 days (around 06/27/2015), or if symptoms worsen or fail to improve, for suture removal.  Caryl Pina, MD [facility] 06/17/2015, 12:58 PM     *

## 2015-06-17 NOTE — Patient Instructions (Signed)

## 2015-06-28 ENCOUNTER — Encounter: Payer: Self-pay | Admitting: Family

## 2015-06-28 ENCOUNTER — Ambulatory Visit (INDEPENDENT_AMBULATORY_CARE_PROVIDER_SITE_OTHER): Payer: Medicare Other | Admitting: Family

## 2015-06-28 VITALS — BP 196/85 | HR 75 | Temp 96.9°F | Ht 61.0 in | Wt 83.4 lb

## 2015-06-28 DIAGNOSIS — Z4802 Encounter for removal of sutures: Secondary | ICD-10-CM | POA: Diagnosis not present

## 2015-06-28 NOTE — Patient Instructions (Signed)
Fall Prevention in the Home  Falls can cause injuries and can affect people from all age groups. There are many simple things that you can do to make your home safe and to help prevent falls. WHAT CAN I DO ON THE OUTSIDE OF MY HOME?  Regularly repair the edges of walkways and driveways and fix any cracks.  Remove high doorway thresholds.  Trim any shrubbery on the main path into your home.  Use bright outdoor lighting.  Clear walkways of debris and clutter, including tools and rocks.  Regularly check that handrails are securely fastened and in good repair. Both sides of any steps should have handrails.  Install guardrails along the edges of any raised decks or porches.  Have leaves, snow, and ice cleared regularly.  Use sand or salt on walkways during winter months.  In the garage, clean up any spills right away, including grease or oil spills. WHAT CAN I DO IN THE BATHROOM?  Use night lights.  Install grab bars by the toilet and in the tub and shower. Do not use towel bars as grab bars.  Use non-skid mats or decals on the floor of the tub or shower.  If you need to sit down while you are in the shower, use a plastic, non-slip stool..  Keep the floor dry. Immediately clean up any water that spills on the floor.  Remove soap buildup in the tub or shower on a regular basis.  Attach bath mats securely with double-sided non-slip rug tape.  Remove throw rugs and other tripping hazards from the floor. WHAT CAN I DO IN THE BEDROOM?  Use night lights.  Make sure that a bedside light is easy to reach.  Do not use oversized bedding that drapes onto the floor.  Have a firm chair that has side arms to use for getting dressed.  Remove throw rugs and other tripping hazards from the floor. WHAT CAN I DO IN THE KITCHEN?   Clean up any spills right away.  Avoid walking on wet floors.  Place frequently used items in easy-to-reach places.  If you need to reach for something  above you, use a sturdy step stool that has a grab bar.  Keep electrical cables out of the way.  Do not use floor polish or wax that makes floors slippery. If you have to use wax, make sure that it is non-skid floor wax.  Remove throw rugs and other tripping hazards from the floor. WHAT CAN I DO IN THE STAIRWAYS?  Do not leave any items on the stairs.  Make sure that there are handrails on both sides of the stairs. Fix handrails that are broken or loose. Make sure that handrails are as long as the stairways.  Check any carpeting to make sure that it is firmly attached to the stairs. Fix any carpet that is loose or worn.  Avoid having throw rugs at the top or bottom of stairways, or secure the rugs with carpet tape to prevent them from moving.  Make sure that you have a light switch at the top of the stairs and the bottom of the stairs. If you do not have them, have them installed. WHAT ARE SOME OTHER FALL PREVENTION TIPS?  Wear closed-toe shoes that fit well and support your feet. Wear shoes that have rubber soles or low heels.  When you use a stepladder, make sure that it is completely opened and that the sides are firmly locked. Have someone hold the ladder while you   are using it. Do not climb a closed stepladder.  Add color or contrast paint or tape to grab bars and handrails in your home. Place contrasting color strips on the first and last steps.  Use mobility aids as needed, such as canes, walkers, scooters, and crutches.  Turn on lights if it is dark. Replace any light bulbs that burn out.  Set up furniture so that there are clear paths. Keep the furniture in the same spot.  Fix any uneven floor surfaces.  Choose a carpet design that does not hide the edge of steps of a stairway.  Be aware of any and all pets.  Review your medicines with your healthcare provider. Some medicines can cause dizziness or changes in blood pressure, which increase your risk of falling. Talk  with your health care provider about other ways that you can decrease your risk of falls. This may include working with a physical therapist or trainer to improve your strength, balance, and endurance.   This information is not intended to replace advice given to you by your health care provider. Make sure you discuss any questions you have with your health care provider.   Document Released: 08/25/2002 Document Revised: 01/19/2015 Document Reviewed: 10/09/2014 Elsevier Interactive Patient Education 2016 [employer]  

## 2015-06-28 NOTE — Progress Notes (Signed)
   Subjective:    Patient ID: Melinda Ayala, female    DOB: [birth year], 81 y.o.   MRN: [id number]  HPI Pt presents to the office today to have 12 sutures removed. Pt was seen on 06/17/15 after a fall and had sutrues placed for a right forearm laceration. Pt denies any drainage, fever, redness, or warmth.     Review of Systems  Constitutional: Negative.   HENT: Negative.   Eyes: Negative.   Respiratory: Negative.  Negative for shortness of breath.   Cardiovascular: Negative.  Negative for palpitations.  Gastrointestinal: Negative.   Endocrine: Negative.   Genitourinary: Negative.   Musculoskeletal: Negative.   Neurological: Negative.  Negative for headaches.  Hematological: Negative.   Psychiatric/Behavioral: Negative.   All other systems reviewed and are negative.      Objective:   Physical Exam  Constitutional: She is oriented to person, place, and time. She appears well-developed and well-nourished. No distress.  HENT:  Head: Normocephalic and atraumatic.  Eyes: Pupils are equal, round, and reactive to light.  Neck: Normal range of motion. Neck supple. No thyromegaly present.  Cardiovascular: Normal rate, regular rhythm, normal heart sounds and intact distal pulses.   No murmur heard. Pulmonary/Chest: Effort normal and breath sounds normal. No respiratory distress. She has no wheezes.  Abdominal: Soft. Bowel sounds are normal. She exhibits no distension. There is no tenderness.  Musculoskeletal: Normal range of motion. She exhibits no edema or tenderness.  Neurological: She is alert and oriented to person, place, and time. She has normal reflexes. No cranial nerve deficit.  Skin: Skin is warm and dry.  Healed laceration on right forearm   Psychiatric: She has a normal mood and affect. Her behavior is normal. Judgment and thought content normal.  Vitals reviewed.  Right forearm cleaned with iodine. 12 sutures removed with no complications. Area cleaned and antibiotic  ointment applied     BP 196/85 mmHg  Pulse 75  Temp(Src) 96.9 F (36.1 C) (Oral)  Ht 5\' 1"  (1.549 m)  Wt 83 lb 6.4 oz (37.83 kg)  BMI 15.77 kg/m2       Assessment & Plan:  1. Visit for suture removal -Keep area clean and dry -S/S of infection disucssed -Falls precaution discussed -RTO prn   Jannifer Rodney, FNP

## 2015-07-02 DIAGNOSIS — Z23 Encounter for immunization: Secondary | ICD-10-CM | POA: Diagnosis not present

## 2015-07-16 ENCOUNTER — Ambulatory Visit (INDEPENDENT_AMBULATORY_CARE_PROVIDER_SITE_OTHER): Payer: Medicare Other | Admitting: Family

## 2015-07-16 ENCOUNTER — Encounter: Payer: Self-pay | Admitting: Family

## 2015-07-16 VITALS — BP 169/67 | HR 82 | Temp 97.3°F | Ht 61.0 in | Wt 83.0 lb

## 2015-07-16 DIAGNOSIS — J0100 Acute maxillary sinusitis, unspecified: Secondary | ICD-10-CM | POA: Diagnosis not present

## 2015-07-16 MED ORDER — AMOXICILLIN-POT CLAVULANATE 875-125 MG PO TABS: 1.0000 | ORAL_TABLET | Freq: Two times a day (BID) | ORAL | Status: DC

## 2015-07-16 MED ORDER — FLUTICASONE PROPIONATE 50 MCG/ACT NA SUSP: 2.0000 | Freq: Every day | NASAL | Status: DC

## 2015-07-16 NOTE — Progress Notes (Signed)
   Subjective:    Patient ID: Melinda Ayala, female    DOB: 1922/02/01, 81 y.o.   MRN: [id number]  Oral Pain  Associated symptoms include sinus pressure.  Sinus Problem This is a new problem. The current episode started in the past 7 days. The problem has been waxing and waning since onset. There has been no fever. Her pain is at a severity of 4/10. The pain is moderate. Associated symptoms include congestion, sinus pressure and sneezing. Pertinent negatives include no ear pain, headaches or shortness of breath. Past treatments include nothing. The treatment provided no relief.      Review of Systems  Constitutional: Negative.   HENT: Positive for congestion, sinus pressure and sneezing. Negative for ear pain.   Eyes: Negative.   Respiratory: Negative.  Negative for shortness of breath.   Cardiovascular: Negative.  Negative for palpitations.  Gastrointestinal: Negative.   Endocrine: Negative.   Genitourinary: Negative.   Musculoskeletal: Negative.   Neurological: Negative.  Negative for headaches.  Hematological: Negative.   Psychiatric/Behavioral: Negative.   All other systems reviewed and are negative.      Objective:   Physical Exam  Constitutional: She is oriented to person, place, and time. She appears well-developed. She appears cachectic. No distress.  HENT:  Head: Normocephalic and atraumatic.  Right Ear: External ear normal.  Nose: Right sinus exhibits maxillary sinus tenderness. Left sinus exhibits maxillary sinus tenderness.  Nasal passage erythemas with mild swelling  Oropharynx erythemas   Eyes: Pupils are equal, round, and reactive to light.  Neck: Normal range of motion. Neck supple. No thyromegaly present.  Cardiovascular: Normal rate, regular rhythm, normal heart sounds and intact distal pulses.   No murmur heard. Pulmonary/Chest: Effort normal and breath sounds normal. No respiratory distress. She has no wheezes.  Abdominal: Soft. Bowel sounds are  normal. She exhibits no distension. There is no tenderness.  Musculoskeletal: Normal range of motion. She exhibits no edema or tenderness.  Neurological: She is alert and oriented to person, place, and time. She has normal reflexes. No cranial nerve deficit.  Skin: Skin is warm and dry.  Psychiatric: She has a normal mood and affect. Her behavior is normal. Judgment and thought content normal.  Vitals reviewed.     Blood pressure 169/67, pulse 82, temperature 97.3 F (36.3 C), temperature source Oral, height 5\' 1"  (1.549 m), weight 83 lb (37.649 kg).     Assessment & Plan:  1. Acute maxillary sinusitis, recurrence not specified -- Take meds as prescribed - Use a cool mist humidifier  -Use saline nose sprays frequently -Saline irrigations of the nose can be very helpful if done frequently.  * 4X daily for 1 week*  * Use of a nettie pot can be helpful with this. Follow directions with this* -Force fluids -For any cough or congestion  Use plain Mucinex- regular strength or max strength is fine   * Children- consult with Pharmacist for dosing -For fever or aces or pains- take tylenol or ibuprofen appropriate for age and weight.  * for fevers greater than 101 orally you may alternate ibuprofen and tylenol every  3 hours. -Throat lozenges if helps - fluticasone (FLONASE) 50 MCG/ACT nasal spray; Place 2 sprays into both nostrils daily.  Dispense: 16 g; Refill: 6 - amoxicillin-clavulanate (AUGMENTIN) 875-125 MG tablet; Take 1 tablet by mouth 2 (two) times daily.  Dispense: 14 tablet; Refill: 0  Jannifer Rodney, FNP

## 2015-07-16 NOTE — Patient Instructions (Signed)
Sinusitis, Adult Sinusitis is redness, soreness, and inflammation of the paranasal sinuses. Paranasal sinuses are air pockets within the bones of your face. They are located beneath your eyes, in the middle of your forehead, and above your eyes. In healthy paranasal sinuses, mucus is able to drain out, and air is able to circulate through them by way of your nose. However, when your paranasal sinuses are inflamed, mucus and air can become trapped. This can allow bacteria and other germs to grow and cause infection. Sinusitis can develop quickly and last only a short time (acute) or continue over a long period (chronic). Sinusitis that lasts for more than 12 weeks is considered chronic. CAUSES Causes of sinusitis include:  Allergies.  Structural abnormalities, such as displacement of the cartilage that separates your nostrils (deviated septum), which can decrease the air flow through your nose and sinuses and affect sinus drainage.  Functional abnormalities, such as when the small hairs (cilia) that line your sinuses and help remove mucus do not work properly or are not present. SIGNS AND SYMPTOMS Symptoms of acute and chronic sinusitis are the same. The primary symptoms are pain and pressure around the affected sinuses. Other symptoms include:  Upper toothache.  Earache.  Headache.  Bad breath.  Decreased sense of smell and taste.  A cough, which worsens when you are lying flat.  Fatigue.  Fever.  Thick drainage from your nose, which often is green and may contain pus (purulent).  Swelling and warmth over the affected sinuses. DIAGNOSIS Your health care provider will perform a physical exam. During your exam, your health care provider may perform any of the following to help determine if you have acute sinusitis or chronic sinusitis:  Look in your nose for signs of abnormal growths in your nostrils (nasal polyps).  Tap over the affected sinus to check for signs of  infection.  View the inside of your sinuses using an imaging device that has a light attached (endoscope). If your health care provider suspects that you have chronic sinusitis, one or more of the following tests may be recommended:  Allergy tests.  Nasal culture. A sample of mucus is taken from your nose, sent to a lab, and screened for bacteria.  Nasal cytology. A sample of mucus is taken from your nose and examined by your health care provider to determine if your sinusitis is related to an allergy. TREATMENT Most cases of acute sinusitis are related to a viral infection and will resolve on their own within 10 days. Sometimes, medicines are prescribed to help relieve symptoms of both acute and chronic sinusitis. These may include pain medicines, decongestants, nasal steroid sprays, or saline sprays. However, for sinusitis related to a bacterial infection, your health care provider will prescribe antibiotic medicines. These are medicines that will help kill the bacteria causing the infection. Rarely, sinusitis is caused by a fungal infection. In these cases, your health care provider will prescribe antifungal medicine. For some cases of chronic sinusitis, surgery is needed. Generally, these are cases in which sinusitis recurs more than 3 times per year, despite other treatments. HOME CARE INSTRUCTIONS  Drink plenty of water. Water helps thin the mucus so your sinuses can drain more easily.  Use a humidifier.  Inhale steam 3-4 times a day (for example, sit in the bathroom with the shower running).  Apply a warm, moist washcloth to your face 3-4 times a day, or as directed by your health care provider.  Use saline nasal sprays to help   moisten and clean your sinuses.  Take medicines only as directed by your health care provider.  If you were prescribed either an antibiotic or antifungal medicine, finish it all even if you start to feel better. SEEK IMMEDIATE MEDICAL CARE IF:  You have  increasing pain or severe headaches.  You have nausea, vomiting, or drowsiness.  You have swelling around your face.  You have vision problems.  You have a stiff neck.  You have difficulty breathing.   This information is not intended to replace advice given to you by your health care provider. Make sure you discuss any questions you have with your health care provider.   Document Released: 09/04/2005 Document Revised: 09/25/2014 Document Reviewed: 09/19/2011 Elsevier Interactive Patient Education 2016 [employer] Inc.  - Take meds as prescribed - Use a cool mist humidifier  -Use saline nose sprays frequently -Saline irrigations of the nose can be very helpful if done frequently.  * 4X daily for 1 week*  * Use of a nettie pot can be helpful with this. Follow directions with this* -Force fluids -For any cough or congestion  Use plain Mucinex- regular strength or max strength is fine   * Children- consult with Pharmacist for dosing -For fever or aces or pains- take tylenol or ibuprofen appropriate for age and weight.  * for fevers greater than 101 orally you may alternate ibuprofen and tylenol every  3 hours. -Throat lozenges if help   Cristin Penaflor, FNP   

## 2015-10-25 ENCOUNTER — Other Ambulatory Visit: Payer: Self-pay | Admitting: Family

## 2015-11-05 ENCOUNTER — Encounter: Payer: Self-pay | Admitting: Family

## 2015-11-05 ENCOUNTER — Ambulatory Visit (INDEPENDENT_AMBULATORY_CARE_PROVIDER_SITE_OTHER): Payer: Medicare Other | Admitting: Family

## 2015-11-05 VITALS — BP 160/71 | HR 65 | Temp 97.0°F | Ht 61.0 in | Wt 80.8 lb

## 2015-11-05 DIAGNOSIS — R000 Tachycardia, unspecified: Secondary | ICD-10-CM | POA: Diagnosis not present

## 2015-11-05 DIAGNOSIS — M810 Age-related osteoporosis without current pathological fracture: Secondary | ICD-10-CM

## 2015-11-05 DIAGNOSIS — G40909 Epilepsy, unspecified, not intractable, without status epilepticus: Secondary | ICD-10-CM

## 2015-11-05 DIAGNOSIS — I10 Essential (primary) hypertension: Secondary | ICD-10-CM

## 2015-11-05 NOTE — Addendum Note (Signed)
Addended by: Prescott Gum on: 11/05/2015 09:41 AM   Modules accepted: Kipp Brood

## 2015-11-05 NOTE — Patient Instructions (Signed)
Health Maintenance, Female Adopting a healthy lifestyle and getting preventive care can go a long way to promote health and wellness. Talk with your health care provider about what schedule of regular examinations is right for you. This is a good chance for you to check in with your provider about disease prevention and staying healthy. In between checkups, there are plenty of things you can do on your own. Experts have done a lot of research about which lifestyle changes and preventive measures are most likely to keep you healthy. Ask your health care provider for more information. WEIGHT AND DIET  Eat a healthy diet  Be sure to include plenty of vegetables, fruits, low-fat dairy products, and lean protein.  Do not eat a lot of foods high in solid fats, added sugars, or salt.  Get regular exercise. This is one of the most important things you can do for your health.  Most adults should exercise for at least 150 minutes each week. The exercise should increase your heart rate and make you sweat (moderate-intensity exercise).  Most adults should also do strengthening exercises at least twice a week. This is in addition to the moderate-intensity exercise.  Maintain a healthy weight  Body mass index (BMI) is a measurement that can be used to identify possible weight problems. It estimates body fat based on height and weight. Your health care provider can help determine your BMI and help you achieve or maintain a healthy weight.  For females 20 years of age and older:   A BMI below 18.5 is considered underweight.  A BMI of 18.5 to 24.9 is normal.  A BMI of 25 to 29.9 is considered overweight.  A BMI of 30 and above is considered obese.  Watch levels of cholesterol and blood lipids  You should start having your blood tested for lipids and cholesterol at 80 years of age, then have this test every 5 years.  You may need to have your cholesterol levels checked more often if:  Your lipid  or cholesterol levels are high.  You are older than 80 years of age.  You are at high risk for heart disease.  CANCER SCREENING   Lung Cancer  Lung cancer screening is recommended for adults 55-80 years old who are at high risk for lung cancer because of a history of smoking.  A yearly low-dose CT scan of the lungs is recommended for people who:  Currently smoke.  Have quit within the past 15 years.  Have at least a 30-pack-year history of smoking. A pack year is smoking an average of one pack of cigarettes a day for 1 year.  Yearly screening should continue until it has been 15 years since you quit.  Yearly screening should stop if you develop a health problem that would prevent you from having lung cancer treatment.  Breast Cancer  Practice breast self-awareness. This means understanding how your breasts normally appear and feel.  It also means doing regular breast self-exams. Let your health care provider know about any changes, no matter how small.  If you are in your 20s or 30s, you should have a clinical breast exam (CBE) by a health care provider every 1-3 years as part of a regular health exam.  If you are 40 or older, have a CBE every year. Also consider having a breast X-ray (mammogram) every year.  If you have a family history of breast cancer, talk to your health care provider about genetic screening.  If you   are at high risk for breast cancer, talk to your health care provider about having an MRI and a mammogram every year.  Breast cancer gene (BRCA) assessment is recommended for women who have family members with BRCA-related cancers. BRCA-related cancers include:  Breast.  Ovarian.  Tubal.  Peritoneal cancers.  Results of the assessment will determine the need for genetic counseling and BRCA1 and BRCA2 testing. Cervical Cancer Your health care provider may recommend that you be screened regularly for cancer of the pelvic organs (ovaries, uterus, and  vagina). This screening involves a pelvic examination, including checking for microscopic changes to the surface of your cervix (Pap test). You may be encouraged to have this screening done every 3 years, beginning at age 21.  For women ages 30-65, health care providers may recommend pelvic exams and Pap testing every 3 years, or they may recommend the Pap and pelvic exam, combined with testing for human papilloma virus (HPV), every 5 years. Some types of HPV increase your risk of cervical cancer. Testing for HPV may also be done on women of any age with unclear Pap test results.  Other health care providers may not recommend any screening for nonpregnant women who are considered low risk for pelvic cancer and who do not have symptoms. Ask your health care provider if a screening pelvic exam is right for you.  If you have had past treatment for cervical cancer or a condition that could lead to cancer, you need Pap tests and screening for cancer for at least 20 years after your treatment. If Pap tests have been discontinued, your risk factors (such as having a new sexual partner) need to be reassessed to determine if screening should resume. Some women have medical problems that increase the chance of getting cervical cancer. In these cases, your health care provider may recommend more frequent screening and Pap tests. Colorectal Cancer  This type of cancer can be detected and often prevented.  Routine colorectal cancer screening usually begins at 80 years of age and continues through 80 years of age.  Your health care provider may recommend screening at an earlier age if you have risk factors for colon cancer.  Your health care provider may also recommend using home test kits to check for hidden blood in the stool.  A small camera at the end of a tube can be used to examine your colon directly (sigmoidoscopy or colonoscopy). This is done to check for the earliest forms of colorectal  cancer.  Routine screening usually begins at age 50.  Direct examination of the colon should be repeated every 5-10 years through 80 years of age. However, you may need to be screened more often if early forms of precancerous polyps or small growths are found. Skin Cancer  Check your skin from head to toe regularly.  Tell your health care provider about any new moles or changes in moles, especially if there is a change in a mole's shape or color.  Also tell your health care provider if you have a mole that is larger than the size of a pencil eraser.  Always use sunscreen. Apply sunscreen liberally and repeatedly throughout the day.  Protect yourself by wearing long sleeves, pants, a wide-brimmed hat, and sunglasses whenever you are outside. HEART DISEASE, DIABETES, AND HIGH BLOOD PRESSURE   High blood pressure causes heart disease and increases the risk of stroke. High blood pressure is more likely to develop in:  People who have blood pressure in the high end   of the normal range (130-139/85-89 mm Hg).  People who are overweight or obese.  People who are African American.  If you are 38-23 years of age, have your blood pressure checked every 3-5 years. If you are 61 years of age or older, have your blood pressure checked every year. You should have your blood pressure measured twice--once when you are at a hospital or clinic, and once when you are not at a hospital or clinic. Record the average of the two measurements. To check your blood pressure when you are not at a hospital or clinic, you can use:  An automated blood pressure machine at a pharmacy.  A home blood pressure monitor.  If you are between 45 years and 39 years old, ask your health care provider if you should take aspirin to prevent strokes.  Have regular diabetes screenings. This involves taking a blood sample to check your fasting blood sugar level.  If you are at a normal weight and have a low risk for diabetes,  have this test once every three years after 80 years of age.  If you are overweight and have a high risk for diabetes, consider being tested at a younger age or more often. PREVENTING INFECTION  Hepatitis B  If you have a higher risk for hepatitis B, you should be screened for this virus. You are considered at high risk for hepatitis B if:  You were born in a country where hepatitis B is common. Ask your health care provider which countries are considered high risk.  Your parents were born in a high-risk country, and you have not been immunized against hepatitis B (hepatitis B vaccine).  You have HIV or AIDS.  You use needles to inject street drugs.  You live with someone who has hepatitis B.  You have had sex with someone who has hepatitis B.  You get hemodialysis treatment.  You take certain medicines for conditions, including cancer, organ transplantation, and autoimmune conditions. Hepatitis C  Blood testing is recommended for:  Everyone born from [birth year] through 1963.  Anyone with known risk factors for hepatitis C. Sexually transmitted infections (STIs)  You should be screened for sexually transmitted infections (STIs) including gonorrhea and chlamydia if:  You are sexually active and are younger than 80 years of age.  You are older than 80 years of age and your health care provider tells you that you are at risk for this type of infection.  Your sexual activity has changed since you were last screened and you are at an increased risk for chlamydia or gonorrhea. Ask your health care provider if you are at risk.  If you do not have HIV, but are at risk, it may be recommended that you take a prescription medicine daily to prevent HIV infection. This is called pre-exposure prophylaxis (PrEP). You are considered at risk if:  You are sexually active and do not regularly use condoms or know the HIV status of your partner(s).  You take drugs by injection.  You are sexually  active with a partner who has HIV. Talk with your health care provider about whether you are at high risk of being infected with HIV. If you choose to begin PrEP, you should first be tested for HIV. You should then be tested every 3 months for as long as you are taking PrEP.  PREGNANCY   If you are premenopausal and you may become pregnant, ask your health care provider about preconception counseling.  If you may  become pregnant, take 400 to 800 micrograms (mcg) of folic acid every day.  If you want to prevent pregnancy, talk to your health care provider about birth control (contraception). OSTEOPOROSIS AND MENOPAUSE   Osteoporosis is a disease in which the bones lose minerals and strength with aging. This can result in serious bone fractures. Your risk for osteoporosis can be identified using a bone density scan.  If you are 61 years of age or older, or if you are at risk for osteoporosis and fractures, ask your health care provider if you should be screened.  Ask your health care provider whether you should take a calcium or vitamin D supplement to lower your risk for osteoporosis.  Menopause may have certain physical symptoms and risks.  Hormone replacement therapy may reduce some of these symptoms and risks. Talk to your health care provider about whether hormone replacement therapy is right for you.  HOME CARE INSTRUCTIONS   Schedule regular health, dental, and eye exams.  Stay current with your immunizations.   Do not use any tobacco products including cigarettes, chewing tobacco, or electronic cigarettes.  If you are pregnant, do not drink alcohol.  If you are breastfeeding, limit how much and how often you drink alcohol.  Limit alcohol intake to no more than 1 drink per day for nonpregnant women. One drink equals 12 ounces of beer, 5 ounces of wine, or 1 ounces of hard liquor.  Do not use street drugs.  Do not share needles.  Ask your health care provider for help if  you need support or information about quitting drugs.  Tell your health care provider if you often feel depressed.  Tell your health care provider if you have ever been abused or do not feel safe at home.   This information is not intended to replace advice given to you by your health care provider. Make sure you discuss any questions you have with your health care provider.   Document Released: 03/20/2011 Document Revised: 09/25/2014 Document Reviewed: 08/06/2013 Elsevier Interactive Patient Education [employer].

## 2015-11-05 NOTE — Progress Notes (Addendum)
Subjective:    Patient ID: Melinda Ayala, female    DOB: Dec 12, 1921, 82 y.o.   MRN: [id number]  Pt presents to the office today for chronic follow up.  Hypertension This is a chronic problem. The current episode started more than 1 year ago. The problem has been waxing and waning since onset. The problem is uncontrolled. Pertinent negatives include no anxiety, headaches, palpitations, peripheral edema or shortness of breath. Risk factors for coronary artery disease include post-menopausal state and smoking/tobacco exposure. Past treatments include ACE inhibitors and beta blockers. The current treatment provides significant improvement. There is no history of kidney disease, CAD/MI, CVA, heart failure or a thyroid problem. There is no history of sleep apnea.  Seizures Pt currently taking tegretol and vimpat daily. She is followed by Dr. Merlene Laughter neurologists every 6 months. Pt states she had a "light one" a few months ago, but it had been "a long time since I had one before that".      Review of Systems  Constitutional: Negative.   HENT: Negative.   Eyes: Negative.   Respiratory: Negative.  Negative for shortness of breath.   Cardiovascular: Negative.  Negative for palpitations.  Gastrointestinal: Negative.   Endocrine: Negative.   Genitourinary: Negative.   Musculoskeletal: Negative.   Neurological: Negative.  Negative for headaches.  Hematological: Negative.   Psychiatric/Behavioral: Negative.   All other systems reviewed and are negative.      Objective:   Physical Exam  Constitutional: She is oriented to person, place, and time. She appears well-developed and well-nourished. No distress.  HENT:  Head: Normocephalic and atraumatic.  Right Ear: External ear normal.  Left Ear: External ear normal.  Nose: Nose normal.  Mouth/Throat: Oropharynx is clear and moist.  Eyes: Pupils are equal, round, and reactive to light.  Neck: Normal range of motion. Neck supple. No thyromegaly  present.  Cardiovascular: Normal rate, regular rhythm, normal heart sounds and intact distal pulses.   No murmur heard. Pulmonary/Chest: Effort normal and breath sounds normal. No respiratory distress. She has no wheezes.  Abdominal: Soft. Bowel sounds are normal. She exhibits no distension. There is no tenderness.  Musculoskeletal: Normal range of motion. She exhibits no edema or tenderness.  Neurological: She is alert and oriented to person, place, and time. She has normal reflexes. No cranial nerve deficit.  Skin: Skin is warm and dry.  Psychiatric: She has a normal mood and affect. Her behavior is normal. Judgment and thought content normal.  Vitals reviewed.  BP 177/74 mmHg  Pulse 68  Temp(Src) 97 F (36.1 C) (Oral)  Ht 5' 1" (1.549 m)  Wt 80 lb 12.8 oz (36.651 kg)  BMI 15.28 kg/m2       Assessment & Plan:  1. Essential hypertension - CMP14+EGFR  2. Tachycardia - CMP14+EGFR  3. Osteoporosis, senile - CMP14+EGFR  4. Seizure disorder ([place]) - CMP14+EGFR   Continue all meds, keep all appts with Dr. Merlene Laughter Labs pending Health Maintenance reviewed Diet and exercise encouraged RTO 6 months  Evelina Dun, FNP

## 2015-11-05 NOTE — Addendum Note (Signed)
Addended by: Jannifer Rodney A on: 11/05/2015 09:37 AM   Modules accepted: SmartSet

## 2015-11-06 LAB — CMP14+EGFR
A/G RATIO: 1.7 (ref 1.1–2.5)
ALT: 9 IU/L (ref 0–32)
AST: 19 IU/L (ref 0–40)
Albumin: 4.5 g/dL (ref 3.2–4.6)
Alkaline Phosphatase: 63 IU/L (ref 39–117)
BILIRUBIN TOTAL: 0.4 mg/dL (ref 0.0–1.2)
BUN / CREAT RATIO: 18 (ref 11–26)
BUN: 15 mg/dL (ref 10–36)
CHLORIDE: 101 mmol/L (ref 96–106)
CO2: 27 mmol/L (ref 18–29)
Calcium: 9.9 mg/dL (ref 8.7–10.3)
Creatinine, Ser: 0.85 mg/dL (ref 0.57–1.00)
GFR calc non Af Amer: 60 mL/min/{1.73_m2} (ref >59)
GFR, EST AFRICAN AMERICAN: 69 mL/min/{1.73_m2} (ref >59)
Globulin, Total: 2.6 g/dL (ref 1.5–4.5)
Glucose: 99 mg/dL (ref 65–99)
POTASSIUM: 4.2 mmol/L (ref 3.5–5.2)
SODIUM: 143 mmol/L (ref 134–144)
TOTAL PROTEIN: 7.1 g/dL (ref 6.0–8.5)

## 2015-11-16 DIAGNOSIS — Z79899 Other long term (current) drug therapy: Secondary | ICD-10-CM | POA: Diagnosis not present

## 2015-11-16 DIAGNOSIS — G40909 Epilepsy, unspecified, not intractable, without status epilepticus: Secondary | ICD-10-CM | POA: Diagnosis not present

## 2015-11-16 DIAGNOSIS — R269 Unspecified abnormalities of gait and mobility: Secondary | ICD-10-CM | POA: Diagnosis not present

## 2015-11-17 ENCOUNTER — Other Ambulatory Visit: Payer: Self-pay | Admitting: Family

## 2015-11-27 ENCOUNTER — Other Ambulatory Visit: Payer: Self-pay | Admitting: Family

## 2016-02-28 DIAGNOSIS — S80862A Insect bite (nonvenomous), left lower leg, initial encounter: Secondary | ICD-10-CM | POA: Diagnosis not present

## 2016-02-28 DIAGNOSIS — L03116 Cellulitis of left lower limb: Secondary | ICD-10-CM | POA: Diagnosis not present

## 2016-05-05 ENCOUNTER — Ambulatory Visit (INDEPENDENT_AMBULATORY_CARE_PROVIDER_SITE_OTHER): Payer: Medicare Other | Admitting: Family

## 2016-05-05 ENCOUNTER — Encounter: Payer: Self-pay | Admitting: Family

## 2016-05-05 VITALS — BP 186/79 | HR 71 | Temp 98.3°F | Ht 60.0 in | Wt 82.2 lb

## 2016-05-05 DIAGNOSIS — R636 Underweight: Secondary | ICD-10-CM

## 2016-05-05 DIAGNOSIS — I10 Essential (primary) hypertension: Secondary | ICD-10-CM

## 2016-05-05 DIAGNOSIS — M810 Age-related osteoporosis without current pathological fracture: Secondary | ICD-10-CM | POA: Diagnosis not present

## 2016-05-05 DIAGNOSIS — Z780 Asymptomatic menopausal state: Secondary | ICD-10-CM

## 2016-05-05 DIAGNOSIS — G40909 Epilepsy, unspecified, not intractable, without status epilepticus: Secondary | ICD-10-CM

## 2016-05-05 DIAGNOSIS — M6530 Trigger finger, unspecified finger: Secondary | ICD-10-CM | POA: Diagnosis not present

## 2016-05-05 MED ORDER — LISINOPRIL 10 MG PO TABS: 10.0000 mg | ORAL_TABLET | Freq: Every day | ORAL | 3 refills | Status: DC

## 2016-05-05 NOTE — Patient Instructions (Signed)
Trigger Finger °Trigger finger (digital tendinitis and stenosing tenosynovitis) is a common disorder that causes an often painful catching of the fingers or thumb. It occurs as a clicking, snapping, or locking of a finger in the palm of the hand. This is caused by a problem with the tendons that flex or bend the fingers sliding smoothly through their sheaths. The condition may occur in any finger or a couple fingers at the same time.  °The finger may lock with the finger curled or suddenly straighten out with a snap. This is more common in patients with rheumatoid arthritis and diabetes. Left untreated, the condition may get worse to the point where the finger becomes locked in flexion, like making a fist, or less commonly locked with the finger straightened out. °CAUSES  °· Inflammation and scarring that lead to swelling around the tendon sheath. °· Repeated or forceful movements. °· Rheumatoid arthritis, an autoimmune disease that affects joints. °· Gout. °· Diabetes mellitus. °SIGNS AND SYMPTOMS °· Soreness and swelling of your finger. °· A painful clicking or snapping as you bend and straighten your finger. °DIAGNOSIS  °Your health care provider will do a physical exam of your finger to diagnose trigger finger. °TREATMENT  °· Splinting for 6-8 weeks may be helpful. °· Nonsteroidal anti-inflammatory medicines (NSAIDs) can help to relieve the pain and inflammation. °· Cortisone injections, along with splinting, may speed up recovery. Several injections may be required. Cortisone may give relief after one injection. °· Surgery is another treatment that may be used if conservative treatments do not work. Surgery can be minor, without incisions (a cut does not have to be made), and can be done with a needle through the skin. °· Other surgical choices involve an open procedure in which the surgeon opens the hand through a small incision and cuts the pulley so the tendon can again slide smoothly. Your hand will still  work fine. °HOME CARE INSTRUCTIONS °· Apply ice to the injured area, twice per day: °¨ Put ice in a plastic bag. °¨ Place a towel between your skin and the bag. °¨ Leave the ice on for 20 minutes, 3-4 times a day. °· Rest your hand often. °MAKE SURE YOU:  °· Understand these instructions. °· Will watch your condition. °· Will get help right away if you are not doing well or get worse. °  °This information is not intended to replace advice given to you by your health care provider. Make sure you discuss any questions you have with your health care provider. °  °Document Released: 06/24/2004 Document Revised: 05/07/2013 Document Reviewed: 02/04/2013 °Elsevier Interactive Patient Education ©2016 Elsevier Inc. ° °

## 2016-05-05 NOTE — Progress Notes (Signed)
Subjective:    Patient ID: Melinda Ayala, female    DOB: 05-20-1922, 82 y.o.   MRN: [id number]  Pt presents to the office today for chronic follow up. PT is complaining of left middle finger "being stuck" and not being able to move that finger.  Hypertension  This is a chronic problem. The current episode started more than 1 year ago. The problem has been waxing and waning since onset. The problem is uncontrolled. Pertinent negatives include no anxiety, headaches, palpitations, peripheral edema or shortness of breath. Risk factors for coronary artery disease include post-menopausal state and smoking/tobacco exposure. Past treatments include ACE inhibitors and beta blockers. The current treatment provides significant improvement. There is no history of kidney disease, CAD/MI, CVA, heart failure or a thyroid problem. There is no history of sleep apnea.  Seizures   This is a chronic problem. The current episode started more than 1 week ago ("has been years without seziure"). The problem has been resolved. Pertinent negatives include no headaches.  Seizures Pt currently taking tegretol and vimpat daily. She is followed by Dr. Merlene Laughter neurologists every 6 months. Pt states she has been years since last seizure.  Osteoporosis PT had Bone Density on 12/17/13  Review of Systems  Constitutional: Negative.   HENT: Negative.   Eyes: Negative.   Respiratory: Negative.  Negative for shortness of breath.   Cardiovascular: Negative.  Negative for palpitations.  Gastrointestinal: Negative.   Endocrine: Negative.   Genitourinary: Negative.   Musculoskeletal: Negative.   Neurological: Positive for seizures. Negative for headaches.  Hematological: Negative.   Psychiatric/Behavioral: Negative.   All other systems reviewed and are negative.      Objective:   Physical Exam  Constitutional: She is oriented to person, place, and time. She appears well-developed and well-nourished. No distress.  HENT:    Head: Normocephalic and atraumatic.  Right Ear: External ear normal.  Left Ear: External ear normal.  Nose: Nose normal.  Mouth/Throat: Oropharynx is clear and moist.  Eyes: Pupils are equal, round, and reactive to light.  Neck: Normal range of motion. Neck supple. No thyromegaly present.  Cardiovascular: Normal rate, regular rhythm, normal heart sounds and intact distal pulses.   No murmur heard. Pulmonary/Chest: Effort normal and breath sounds normal. No respiratory distress. She has no wheezes.  Abdominal: Soft. Bowel sounds are normal. She exhibits no distension. There is no tenderness.  Musculoskeletal: Normal range of motion. She exhibits no edema or tenderness.  Neurological: She is alert and oriented to person, place, and time. She has normal reflexes. No cranial nerve deficit.  Skin: Skin is warm and dry.  Psychiatric: She has a normal mood and affect. Her behavior is normal. Judgment and thought content normal.  Vitals reviewed.  BP (!) 174/77   Pulse 73   Temp 98.3 F (36.8 C) (Oral)   Ht 5' (1.524 m)   Wt 82 lb 3.2 oz (37.3 kg)   BMI 16.05 kg/m        Assessment & Plan:  1. Essential hypertension -Lisinopril increased to 10 mg from 2.5 mg  -Dash diet information given -Exercise encouraged - Stress Management  -Continue current meds -RTO in 2 weeks - CMP14+EGFR - lisinopril (PRINIVIL,ZESTRIL) 10 MG tablet; Take 1 tablet (10 mg total) by mouth daily.  Dispense: 90 tablet; Refill: 3  2. Seizure disorder ([place]) - CMP14+EGFR  3. Osteoporosis, senile - CMP14+EGFR - DG [facility] DEXA  4. Underweight - CMP14+EGFR  5. Trigger finger, left - Ambulatory referral to  Orthopedic Surgery - CMP14+EGFR  6. Post-menopausal - CMP14+EGFR - DG [facility] DEXA   Continue all meds Labs pending Health Maintenance reviewed Diet and exercise encouraged RTO 2 weeks to recheck HTN  Evelina Dun, FNP

## 2016-05-06 LAB — CMP14+EGFR
ALK PHOS: 77 IU/L (ref 39–117)
ALT: 11 IU/L (ref 0–32)
AST: 22 IU/L (ref 0–40)
Albumin/Globulin Ratio: 1.6 (ref 1.2–2.2)
Albumin: 4.7 g/dL — ABNORMAL HIGH (ref 3.2–4.6)
BILIRUBIN TOTAL: 0.4 mg/dL (ref 0.0–1.2)
BUN/Creatinine Ratio: 11 — ABNORMAL LOW (ref 12–28)
BUN: 10 mg/dL (ref 10–36)
CHLORIDE: 102 mmol/L (ref 96–106)
CO2: 26 mmol/L (ref 18–29)
CREATININE: 0.89 mg/dL (ref 0.57–1.00)
Calcium: 10.2 mg/dL (ref 8.7–10.3)
GFR calc Af Amer: 66 mL/min/{1.73_m2} (ref >59)
GFR calc non Af Amer: 57 mL/min/{1.73_m2} — ABNORMAL LOW (ref >59)
GLOBULIN, TOTAL: 2.9 g/dL (ref 1.5–4.5)
Glucose: 95 mg/dL (ref 65–99)
POTASSIUM: 4.4 mmol/L (ref 3.5–5.2)
SODIUM: 144 mmol/L (ref 134–144)
Total Protein: 7.6 g/dL (ref 6.0–8.5)

## 2016-05-15 DIAGNOSIS — G40909 Epilepsy, unspecified, not intractable, without status epilepticus: Secondary | ICD-10-CM | POA: Diagnosis not present

## 2016-05-15 DIAGNOSIS — R42 Dizziness and giddiness: Secondary | ICD-10-CM | POA: Diagnosis not present

## 2016-05-15 DIAGNOSIS — Z79899 Other long term (current) drug therapy: Secondary | ICD-10-CM | POA: Diagnosis not present

## 2016-05-15 DIAGNOSIS — R269 Unspecified abnormalities of gait and mobility: Secondary | ICD-10-CM | POA: Diagnosis not present

## 2016-05-15 DIAGNOSIS — I10 Essential (primary) hypertension: Secondary | ICD-10-CM | POA: Diagnosis not present

## 2016-05-18 ENCOUNTER — Other Ambulatory Visit: Payer: Self-pay | Admitting: *Deleted

## 2016-05-18 DIAGNOSIS — I10 Essential (primary) hypertension: Secondary | ICD-10-CM

## 2016-05-18 MED ORDER — LISINOPRIL 10 MG PO TABS: 10.0000 mg | ORAL_TABLET | Freq: Every day | ORAL | 3 refills | Status: DC

## 2016-05-20 ENCOUNTER — Other Ambulatory Visit: Payer: Self-pay | Admitting: Family

## 2016-05-23 ENCOUNTER — Telehealth: Payer: Self-pay | Admitting: Physician Assistant

## 2016-05-23 DIAGNOSIS — I10 Essential (primary) hypertension: Secondary | ICD-10-CM

## 2016-05-23 MED ORDER — LISINOPRIL 10 MG PO TABS: 10.0000 mg | ORAL_TABLET | Freq: Every day | ORAL | 3 refills | Status: AC

## 2016-05-23 NOTE — Telephone Encounter (Signed)
Prescription sent to pharmacy.

## 2016-06-03 DIAGNOSIS — Z23 Encounter for immunization: Secondary | ICD-10-CM | POA: Diagnosis not present

## 2016-06-08 DIAGNOSIS — M65332 Trigger finger, left middle finger: Secondary | ICD-10-CM | POA: Diagnosis not present

## 2016-06-13 DIAGNOSIS — M65332 Trigger finger, left middle finger: Secondary | ICD-10-CM | POA: Diagnosis not present

## 2016-07-05 DIAGNOSIS — M65842 Other synovitis and tenosynovitis, left hand: Secondary | ICD-10-CM | POA: Diagnosis not present

## 2016-07-05 DIAGNOSIS — M65332 Trigger finger, left middle finger: Secondary | ICD-10-CM | POA: Diagnosis not present

## 2016-07-10 ENCOUNTER — Telehealth: Payer: Self-pay | Admitting: *Deleted

## 2016-07-10 NOTE — Telephone Encounter (Signed)
Patient's daughter called to ask if our office had received any paper work from an assisted living in [place], [place] for her mother.  She states she was told to call our office and find out if we had sent the Assisted Living the requested information.  Please call patient's daughter Eber Jones at [phone].

## 2016-07-11 ENCOUNTER — Ambulatory Visit (INDEPENDENT_AMBULATORY_CARE_PROVIDER_SITE_OTHER): Payer: Medicare Other | Admitting: *Deleted

## 2016-07-11 DIAGNOSIS — Z111 Encounter for screening for respiratory tuberculosis: Secondary | ICD-10-CM

## 2016-07-11 DIAGNOSIS — Z23 Encounter for immunization: Secondary | ICD-10-CM

## 2016-07-11 NOTE — Telephone Encounter (Signed)
Spoke to daughter, they need an FL2 from [place], she is coming to get PPD  Read on Thursday and I will fax

## 2016-07-11 NOTE — Progress Notes (Signed)
PPD placed R arm Tolerated well 

## 2016-07-13 ENCOUNTER — Encounter: Payer: Self-pay | Admitting: *Deleted

## 2016-07-13 LAB — TB SKIN TEST
Induration: 0 mm
TB Skin Test: NEGATIVE

## 2016-07-17 DIAGNOSIS — M65332 Trigger finger, left middle finger: Secondary | ICD-10-CM | POA: Diagnosis not present

## 2016-07-17 DIAGNOSIS — Z4789 Encounter for other orthopedic aftercare: Secondary | ICD-10-CM | POA: Diagnosis not present

## 2016-07-28 ENCOUNTER — Telehealth: Payer: Self-pay | Admitting: Family

## 2016-07-28 NOTE — Telephone Encounter (Signed)
Called and faxed forms

## 2016-08-08 ENCOUNTER — Ambulatory Visit (INDEPENDENT_AMBULATORY_CARE_PROVIDER_SITE_OTHER): Payer: Medicare Other | Admitting: Family

## 2016-08-08 ENCOUNTER — Ambulatory Visit (INDEPENDENT_AMBULATORY_CARE_PROVIDER_SITE_OTHER): Payer: Medicare Other

## 2016-08-08 ENCOUNTER — Encounter: Payer: Self-pay | Admitting: Family

## 2016-08-08 VITALS — BP 156/67 | HR 70 | Temp 97.0°F | Ht 60.0 in | Wt 83.0 lb

## 2016-08-08 DIAGNOSIS — R636 Underweight: Secondary | ICD-10-CM | POA: Diagnosis not present

## 2016-08-08 DIAGNOSIS — I10 Essential (primary) hypertension: Secondary | ICD-10-CM

## 2016-08-08 DIAGNOSIS — G40909 Epilepsy, unspecified, not intractable, without status epilepticus: Secondary | ICD-10-CM

## 2016-08-08 DIAGNOSIS — M810 Age-related osteoporosis without current pathological fracture: Secondary | ICD-10-CM

## 2016-08-08 NOTE — Progress Notes (Signed)
   Subjective:    Patient ID: Melinda Ayala, female    DOB: [birth year], 82 y.o.   MRN: [id number]  Pt presents to the office today for chronic follow up. Hypertension  This is a chronic problem. The current episode started more than 1 year ago. The problem has been waxing and waning since onset. The problem is uncontrolled. Pertinent negatives include no anxiety, headaches, palpitations, peripheral edema or shortness of breath. Risk factors for coronary artery disease include post-menopausal state and smoking/tobacco exposure. Past treatments include ACE inhibitors and beta blockers. The current treatment provides significant improvement. There is no history of kidney disease, CAD/MI, CVA, heart failure or a thyroid problem. There is no history of sleep apnea.  Seizures   This is a chronic problem. The current episode started more than 1 week ago ("has been years without seziure"). The problem has been resolved. Pertinent negatives include no headaches.  Seizures Pt currently taking tegretol and vimpat daily. She is followed by Dr. Merlene Laughter neurologists every 6 months. Pt states she has been years since last seizure.  Osteoporosis PT had Bone Density on 12/17/13  Review of Systems  Constitutional: Negative.   HENT: Negative.   Eyes: Negative.   Respiratory: Negative.  Negative for shortness of breath.   Cardiovascular: Negative.  Negative for palpitations.  Gastrointestinal: Negative.   Endocrine: Negative.   Genitourinary: Negative.   Musculoskeletal: Negative.   Neurological: Positive for seizures. Negative for headaches.  Hematological: Negative.   Psychiatric/Behavioral: Negative.   All other systems reviewed and are negative.      Objective:   Physical Exam  Constitutional: She is oriented to person, place, and time. She appears well-developed and well-nourished. No distress.  HENT:  Head: Normocephalic and atraumatic.  Right Ear: External ear normal.  Left Ear: External  ear normal.  Nose: Nose normal.  Mouth/Throat: Oropharynx is clear and moist.  Eyes: Pupils are equal, round, and reactive to light.  Neck: Normal range of motion. Neck supple. No thyromegaly present.  Cardiovascular: Normal rate, regular rhythm, normal heart sounds and intact distal pulses.   No murmur heard. Pulmonary/Chest: Effort normal and breath sounds normal. No respiratory distress. She has no wheezes.  Abdominal: Soft. Bowel sounds are normal. She exhibits no distension. There is no tenderness.  Musculoskeletal: Normal range of motion. She exhibits no edema or tenderness.  Neurological: She is alert and oriented to person, place, and time. She has normal reflexes. No cranial nerve deficit.  Skin: Skin is warm and dry.  Psychiatric: She has a normal mood and affect. Her behavior is normal. Judgment and thought content normal.  Vitals reviewed.  BP (!) 185/79   Pulse 78   Temp 97 F (36.1 C) (Oral)   Ht 5' (1.524 m)   Wt 83 lb (37.6 kg)   BMI 16.21 kg/m        Assessment & Plan:  1. Essential hypertension - CMP14+EGFR  2. Osteoporosis, senile - CMP14+EGFR - DG [facility] DEXA  3. Underweight - CMP14+EGFR - DG [facility] DEXA  4. Seizure disorder ([place]) - CMP14+EGFR   Continue all meds Labs pending Health Maintenance reviewed Diet and exercise encouraged RTO 6 months  Evelina Dun, FNP

## 2016-08-08 NOTE — Patient Instructions (Signed)
Hypertension Hypertension is another name for high blood pressure. High blood pressure forces your heart to work harder to pump blood. A blood pressure reading has two numbers, which includes a higher number over a lower number (example: 110/72). Follow these instructions at home:  Have your blood pressure rechecked by your doctor.  Only take medicine as told by your doctor. Follow the directions carefully. The medicine does not work as well if you skip doses. Skipping doses also puts you at risk for problems.  Do not smoke.  Monitor your blood pressure at home as told by your doctor. Contact a doctor if:  You think you are having a reaction to the medicine you are taking.  You have repeat headaches or feel dizzy.  You have puffiness (swelling) in your ankles.  You have trouble with your vision. Get help right away if:  You get a very bad headache and are confused.  You feel weak, numb, or faint.  You get chest or belly (abdominal) pain.  You throw up (vomit).  You cannot breathe very well. This information is not intended to replace advice given to you by your health care provider. Make sure you discuss any questions you have with your health care provider. Document Released: 02/21/2008 Document Revised: 02/10/2016 Document Reviewed: 06/27/2013 Elsevier Interactive Patient Education  2017 [employer]  

## 2016-08-09 ENCOUNTER — Ambulatory Visit: Payer: Medicare Other | Admitting: Family

## 2016-08-09 LAB — CMP14+EGFR
ALBUMIN: 4.3 g/dL (ref 3.2–4.6)
ALK PHOS: 80 IU/L (ref 39–117)
ALT: 8 IU/L (ref 0–32)
AST: 16 IU/L (ref 0–40)
Albumin/Globulin Ratio: 1.4 (ref 1.2–2.2)
BILIRUBIN TOTAL: 0.4 mg/dL (ref 0.0–1.2)
BUN / CREAT RATIO: 12 (ref 12–28)
BUN: 10 mg/dL (ref 10–36)
CHLORIDE: 102 mmol/L (ref 96–106)
CO2: 26 mmol/L (ref 18–29)
Calcium: 9.6 mg/dL (ref 8.7–10.3)
Creatinine, Ser: 0.81 mg/dL (ref 0.57–1.00)
GFR calc Af Amer: 73 mL/min/{1.73_m2} (ref >59)
GFR calc non Af Amer: 63 mL/min/{1.73_m2} (ref >59)
GLOBULIN, TOTAL: 3 g/dL (ref 1.5–4.5)
GLUCOSE: 98 mg/dL (ref 65–99)
Potassium: 3.9 mmol/L (ref 3.5–5.2)
SODIUM: 143 mmol/L (ref 134–144)
Total Protein: 7.3 g/dL (ref 6.0–8.5)

## 2016-08-17 ENCOUNTER — Encounter: Payer: Self-pay | Admitting: Pharmacist

## 2016-08-17 ENCOUNTER — Ambulatory Visit (INDEPENDENT_AMBULATORY_CARE_PROVIDER_SITE_OTHER): Payer: Medicare Other | Admitting: Pharmacist

## 2016-08-17 DIAGNOSIS — M810 Age-related osteoporosis without current pathological fracture: Secondary | ICD-10-CM

## 2016-08-17 MED ORDER — RISEDRONATE SODIUM 150 MG PO TABS: 150.0000 mg | ORAL_TABLET | ORAL | 3 refills | Status: AC

## 2016-08-17 NOTE — Patient Instructions (Signed)
Fall Prevention in the Home Falls can cause injuries and can affect people from all age groups. There are many simple things that you can do to make your home safe and to help prevent falls. What can I do on the outside of my home?  Regularly repair the edges of walkways and driveways and fix any cracks.  Remove high doorway thresholds.  Trim any shrubbery on the main path into your home.  Use bright outdoor lighting.  Clear walkways of debris and clutter, including tools and rocks.  Regularly check that handrails are securely fastened and in good repair. Both sides of any steps should have handrails.  Install guardrails along the edges of any raised decks or porches.  Have leaves, snow, and ice cleared regularly.  Use sand or salt on walkways during winter months.  In the garage, clean up any spills right away, including grease or oil spills. What can I do in the bathroom?  Use night lights.  Install grab bars by the toilet and in the tub and shower. Do not use towel bars as grab bars.  Use non-skid mats or decals on the floor of the tub or shower.  If you need to sit down while you are in the shower, use a plastic, non-slip stool.  Keep the floor dry. Immediately clean up any water that spills on the floor.  Remove soap buildup in the tub or shower on a regular basis.  Attach bath mats securely with double-sided non-slip rug tape.  Remove throw rugs and other tripping hazards from the floor. What can I do in the bedroom?  Use night lights.  Make sure that a bedside light is easy to reach.  Do not use oversized bedding that drapes onto the floor.  Have a firm chair that has side arms to use for getting dressed.  Remove throw rugs and other tripping hazards from the floor. What can I do in the kitchen?  Clean up any spills right away.  Avoid walking on wet floors.  Place frequently used items in easy-to-reach places.  If you need to reach for something above  you, use a sturdy step stool that has a grab bar.  Keep electrical cables out of the way.  Do not use floor polish or wax that makes floors slippery. If you have to use wax, make sure that it is non-skid floor wax.  Remove throw rugs and other tripping hazards from the floor. What can I do in the stairways?  Do not leave any items on the stairs.  Make sure that there are handrails on both sides of the stairs. Fix handrails that are broken or loose. Make sure that handrails are as long as the stairways.  Check any carpeting to make sure that it is firmly attached to the stairs. Fix any carpet that is loose or worn.  Avoid having throw rugs at the top or bottom of stairways, or secure the rugs with carpet tape to prevent them from moving.  Make sure that you have a light switch at the top of the stairs and the bottom of the stairs. If you do not have them, have them installed. What are some other fall prevention tips?  Wear closed-toe shoes that fit well and support your feet. Wear shoes that have rubber soles or low heels.  When you use a stepladder, make sure that it is completely opened and that the sides are firmly locked. Have someone hold the ladder while you are using   it. Do not climb a closed stepladder.  Add color or contrast paint or tape to grab bars and handrails in your home. Place contrasting color strips on the first and last steps.  Use mobility aids as needed, such as canes, walkers, scooters, and crutches.  Turn on lights if it is dark. Replace any light bulbs that burn out.  Set up furniture so that there are clear paths. Keep the furniture in the same spot.  Fix any uneven floor surfaces.  Choose a carpet design that does not hide the edge of steps of a stairway.  Be aware of any and all pets.  Review your medicines with your healthcare provider. Some medicines can cause dizziness or changes in blood pressure, which increase your risk of falling. Talk with  your health care provider about other ways that you can decrease your risk of falls. This may include working with a physical therapist or trainer to improve your strength, balance, and endurance. This information is not intended to replace advice given to you by your health care provider. Make sure you discuss any questions you have with your health care provider. Document Released: 08/25/2002 Document Revised: 02/01/2016 Document Reviewed: 10/09/2014 Elsevier Interactive Patient Education  2017 [employer] Inc[employer]  

## 2016-08-17 NOTE — Progress Notes (Signed)
Patient ID: Melinda Ayala, female   DOB: 30-Aug-1922, 82 y.o.   MRN: [id number]   HPI: Patient with history of osteoporosis here today to recheck DEXA.  She was took alendronate from 12/17/2013 until 05/05/2016.  She stopped taking because she was having a difficult time remembering to take the alendronate weekly and to follow administration directions.  She also previously took Forteo for 2 years (09/2011 to 09/2013) for Osteoporosis/Osteopenia  Patient has history of seizures and on antiseizure medications which can affect Vitmamin D and increase risk of osteoporosis and fracture.  Back Pain?  Yes       Kyphosis?  Yes Prior fracture?  Yes - ribs                                                      PMH: Age at menopause:  82 yo Hysterectomy?  No Oophorectomy?  No HRT? No Steroid Use?  No Thyroid med?  No History of cancer?  No History of digestive disorders (ie Crohn's)?  No Current or previous eating disorders?  No Last Vitamin D Result:  34.6 (04/06/2014) Last GFR Result:  63 (08/08/2016)   FH/SH: Family history of osteoporosis?  No Parent with history of hip fracture?  No Family history of breast cancer?  Yes - duaghter Exercise?  No Smoking?  No - but dips Alcohol?  No    Current Height: Height: 5' (152.4 cm)      Max Lifetime Height:  5\' 3"  Current Weight: Weight: 84 lb (38.1 kg)       Calcium Assessment Calcium Intake  # of servings/day  Calcium mg  Milk (8 oz) 2  x  300  = 600mg   Yogurt (4 oz) 0 x  200 = 0  Cheese (1 oz) 0 x  200 = 0  Other Calcium sources   250mg   Ca supplement MVI = 400mg    Estimated calcium intake per day 1250mg     DEXA Results Date of Test T-Score for AP Spine L1-L4 T-Score for neck of right hip T-Score for neck of left hip  08/08/2016 -3.7 -4.2 -3.9  12/17/2013 -3.7 -4.1 -3.5  10/02/2012 -3.8 -4.0 -3.6  08/30/2011 -4.8 -4.2 -3.9        Assessment: Osteoporosis with slight decreases in BMD over the last 2 years.  Questionable  compliance reported by daughter with alendronate due to weekly dosing and adminstration requirements.   Recommendations: 1.  Start  Actonel 150mg  - take 1 tablet once per month 2.  continue calcium 1200mg  daily through supplementation or diet.  3.  recommend chair exercises to build leg and core strength 4.  Counseled and educated about fall risk and prevention.  Recheck DEXA:  2 years  Time spent counseling patient:  30 minutes

## 2016-08-28 ENCOUNTER — Telehealth: Payer: Self-pay

## 2016-09-06 NOTE — Telephone Encounter (Signed)
x

## 2016-09-19 ENCOUNTER — Ambulatory Visit (INDEPENDENT_AMBULATORY_CARE_PROVIDER_SITE_OTHER): Payer: Medicare Other

## 2016-09-19 DIAGNOSIS — Z111 Encounter for screening for respiratory tuberculosis: Secondary | ICD-10-CM | POA: Diagnosis not present

## 2016-09-21 ENCOUNTER — Ambulatory Visit (INDEPENDENT_AMBULATORY_CARE_PROVIDER_SITE_OTHER): Payer: Medicare Other | Admitting: *Deleted

## 2016-09-21 DIAGNOSIS — Z111 Encounter for screening for respiratory tuberculosis: Secondary | ICD-10-CM

## 2016-09-21 LAB — TB SKIN TEST
Induration: 0 mm
TB Skin Test: NEGATIVE

## 2016-09-21 NOTE — Progress Notes (Signed)
PPD read negative Result faxed to [facility] [phone]

## 2016-09-25 ENCOUNTER — Telehealth: Payer: Self-pay | Admitting: Family

## 2016-09-25 NOTE — Telephone Encounter (Signed)
In the back for Christy to sign

## 2016-10-03 DIAGNOSIS — Z79899 Other long term (current) drug therapy: Secondary | ICD-10-CM | POA: Diagnosis not present

## 2016-10-05 DIAGNOSIS — I10 Essential (primary) hypertension: Secondary | ICD-10-CM | POA: Diagnosis not present

## 2016-10-05 DIAGNOSIS — F445 Conversion disorder with seizures or convulsions: Secondary | ICD-10-CM | POA: Diagnosis not present

## 2016-10-05 DIAGNOSIS — M810 Age-related osteoporosis without current pathological fracture: Secondary | ICD-10-CM | POA: Diagnosis not present

## 2016-10-09 ENCOUNTER — Other Ambulatory Visit: Payer: Self-pay | Admitting: Family

## 2016-10-10 ENCOUNTER — Telehealth: Payer: Self-pay | Admitting: Family

## 2016-10-11 DIAGNOSIS — Z111 Encounter for screening for respiratory tuberculosis: Secondary | ICD-10-CM | POA: Diagnosis not present

## 2016-10-11 DIAGNOSIS — J449 Chronic obstructive pulmonary disease, unspecified: Secondary | ICD-10-CM | POA: Diagnosis not present

## 2016-10-11 DIAGNOSIS — R05 Cough: Secondary | ICD-10-CM | POA: Diagnosis not present

## 2016-10-11 DIAGNOSIS — R269 Unspecified abnormalities of gait and mobility: Secondary | ICD-10-CM | POA: Diagnosis not present

## 2016-10-11 DIAGNOSIS — M6281 Muscle weakness (generalized): Secondary | ICD-10-CM | POA: Diagnosis not present

## 2016-10-11 DIAGNOSIS — R062 Wheezing: Secondary | ICD-10-CM | POA: Diagnosis not present

## 2016-10-11 DIAGNOSIS — R5383 Other fatigue: Secondary | ICD-10-CM | POA: Diagnosis not present

## 2016-10-11 DIAGNOSIS — R54 Age-related physical debility: Secondary | ICD-10-CM | POA: Diagnosis not present

## 2016-10-11 NOTE — Telephone Encounter (Signed)
refaxed 10/11/16

## 2016-10-12 DIAGNOSIS — R64 Cachexia: Secondary | ICD-10-CM | POA: Diagnosis not present

## 2016-10-12 DIAGNOSIS — R531 Weakness: Secondary | ICD-10-CM | POA: Diagnosis not present

## 2016-10-12 DIAGNOSIS — I10 Essential (primary) hypertension: Secondary | ICD-10-CM | POA: Diagnosis not present

## 2016-10-12 DIAGNOSIS — M810 Age-related osteoporosis without current pathological fracture: Secondary | ICD-10-CM | POA: Diagnosis not present

## 2016-10-12 DIAGNOSIS — I2109 ST elevation (STEMI) myocardial infarction involving other coronary artery of anterior wall: Secondary | ICD-10-CM | POA: Diagnosis not present

## 2016-10-12 DIAGNOSIS — R570 Cardiogenic shock: Secondary | ICD-10-CM | POA: Diagnosis not present

## 2016-10-12 DIAGNOSIS — I219 Acute myocardial infarction, unspecified: Secondary | ICD-10-CM | POA: Diagnosis not present

## 2016-10-12 DIAGNOSIS — R0682 Tachypnea, not elsewhere classified: Secondary | ICD-10-CM | POA: Diagnosis not present

## 2016-10-12 DIAGNOSIS — J9601 Acute respiratory failure with hypoxia: Secondary | ICD-10-CM | POA: Diagnosis not present

## 2016-10-12 DIAGNOSIS — R062 Wheezing: Secondary | ICD-10-CM | POA: Diagnosis not present

## 2016-10-12 DIAGNOSIS — R001 Bradycardia, unspecified: Secondary | ICD-10-CM | POA: Diagnosis not present

## 2016-10-12 DIAGNOSIS — G40909 Epilepsy, unspecified, not intractable, without status epilepticus: Secondary | ICD-10-CM | POA: Diagnosis not present

## 2016-10-12 DIAGNOSIS — R079 Chest pain, unspecified: Secondary | ICD-10-CM | POA: Diagnosis not present

## 2016-10-12 DIAGNOSIS — J810 Acute pulmonary edema: Secondary | ICD-10-CM | POA: Diagnosis not present

## 2016-10-13 DIAGNOSIS — Z87891 Personal history of nicotine dependence: Secondary | ICD-10-CM | POA: Diagnosis not present

## 2016-10-13 DIAGNOSIS — I10 Essential (primary) hypertension: Secondary | ICD-10-CM | POA: Diagnosis present

## 2016-10-13 DIAGNOSIS — J9601 Acute respiratory failure with hypoxia: Secondary | ICD-10-CM | POA: Diagnosis present

## 2016-10-13 DIAGNOSIS — Z66 Do not resuscitate: Secondary | ICD-10-CM | POA: Diagnosis present

## 2016-10-13 DIAGNOSIS — I2109 ST elevation (STEMI) myocardial infarction involving other coronary artery of anterior wall: Secondary | ICD-10-CM | POA: Diagnosis present

## 2016-10-13 DIAGNOSIS — J810 Acute pulmonary edema: Secondary | ICD-10-CM | POA: Diagnosis present

## 2016-10-13 DIAGNOSIS — I219 Acute myocardial infarction, unspecified: Secondary | ICD-10-CM | POA: Diagnosis not present

## 2016-10-13 DIAGNOSIS — R64 Cachexia: Secondary | ICD-10-CM | POA: Diagnosis present

## 2016-10-13 DIAGNOSIS — R079 Chest pain, unspecified: Secondary | ICD-10-CM | POA: Diagnosis not present

## 2016-10-13 DIAGNOSIS — Z515 Encounter for palliative care: Secondary | ICD-10-CM | POA: Diagnosis present

## 2016-10-13 DIAGNOSIS — R570 Cardiogenic shock: Secondary | ICD-10-CM | POA: Diagnosis present

## 2016-10-13 DIAGNOSIS — G40909 Epilepsy, unspecified, not intractable, without status epilepticus: Secondary | ICD-10-CM | POA: Diagnosis present

## 2016-10-19 DEATH — deceased

## 2017-02-05 ENCOUNTER — Ambulatory Visit: Payer: Medicare Other | Admitting: Family
